# Patient Record
Sex: Female | Born: 2000 | Race: Black or African American | Hispanic: No | Marital: Single | Smoking: Never smoker
Health system: Southern US, Community
[De-identification: ages and names within clinical notes are randomized; demographics above are authoritative.]

## PROBLEM LIST (undated history)

## (undated) DIAGNOSIS — N920 Excessive and frequent menstruation with regular cycle: Secondary | ICD-10-CM

## (undated) DIAGNOSIS — Z789 Other specified health status: Secondary | ICD-10-CM

## (undated) DIAGNOSIS — D649 Anemia, unspecified: Secondary | ICD-10-CM

## (undated) HISTORY — PX: NO PAST SURGERIES: SHX2092

## (undated) HISTORY — DX: Excessive and frequent menstruation with regular cycle: N92.0

---
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
## 2020-03-08 ENCOUNTER — Other Ambulatory Visit: Payer: Self-pay

## 2020-03-08 ENCOUNTER — Ambulatory Visit
Admission: EM | Admit: 2020-03-08 | Discharge: 2020-03-08 | Disposition: A | Discharge status: Home / Self Care | Payer: BLUE CROSS/BLUE SHIELD | Attending: Emergency Medicine | Admitting: Emergency Medicine

## 2020-03-08 ENCOUNTER — Encounter: Payer: Self-pay | Admitting: Emergency Medicine

## 2020-03-08 DIAGNOSIS — Z1152 Encounter for screening for COVID-19: Secondary | ICD-10-CM

## 2020-03-08 MED ORDER — CETIRIZINE HCL 10 MG PO TABS
10.0000 mg | ORAL_TABLET | Freq: Every day | ORAL | 0 refills | Status: DC
Start: 2020-03-08 — End: 2020-05-23

## 2020-03-08 MED ORDER — BENZONATATE 100 MG PO CAPS
100.0000 mg | ORAL_CAPSULE | Freq: Three times a day (TID) | ORAL | 0 refills | Status: DC
Start: 2020-03-08 — End: 2022-03-16

## 2020-03-08 MED ORDER — FLUTICASONE PROPIONATE 50 MCG/ACT NA SUSP
2.0000 | Freq: Every day | NASAL | 0 refills | Status: DC
Start: 2020-03-08 — End: 2020-05-23

## 2020-03-08 NOTE — ED Provider Notes (Signed)
[name]    CSN: [id number] Arrival date & time: 03/08/20  1359      History   Chief Complaint Chief Complaint  Patient presents with  . Sore Throat  . Fatigue    HPI Kristine Conway is a 20 y.o. female  History was provided by the patient. Kristine Conway is a 20 y.o. female who presents for evaluation of a sore throat. Associated symptoms include sore throat. Onset of symptoms was 3 days ago, unchanged since that time.  She is drinking plenty of fluids. She has not had recent close exposure to someone with proven streptococcal pharyngitis. The following portions of the patient's history were reviewed and updated as appropriate: allergies, current medications, past family history, past medical history, past social history, past surgical history and problem list.     Past Medical History:  Diagnosis Date  . Anemia   . Medical history non-contributory   . Menorrhagia with regular cycle 03/17/2017    Patient Active Problem List   Diagnosis Date Noted  . Chlamydia 10/05/2018  . Menorrhagia with regular cycle 03/17/2017  . Iron deficiency anemia due to chronic blood loss 03/17/2017    Past Surgical History:  Procedure Laterality Date  . NO PAST SURGERIES      OB History    Gravida  0   Para  0   Term  0   Preterm  0   AB  0   Living  0     SAB  0   IAB  0   Ectopic  0   Multiple  0   Live Births  0            Home Medications    Prior to Admission medications   Medication Sig Start Date End Date Taking? Authorizing Provider  benzonatate (TESSALON) 100 MG capsule Take 1 capsule (100 mg total) by mouth every 8 (eight) hours. 03/08/20  Yes Hall-Potvin, [place], PA-C  cetirizine (ZYRTEC ALLERGY) 10 MG tablet Take 1 tablet (10 mg total) by mouth daily. 03/08/20  Yes Hall-Potvin, [place], PA-C  fluticasone (FLONASE) 50 MCG/ACT nasal spray Place 2 sprays into both nostrils daily. 03/08/20   Hall-Potvin, [place], PA-C  ipratropium  (ATROVENT) 0.06 % nasal spray Place 2 sprays into both nostrils 4 (four) times daily. 07/16/19   Cathie Hoops, Amy V, PA-C  omeprazole (PRILOSEC) 10 MG capsule Take 10 mg by mouth daily.    [provider]    Family History Family History  Problem Relation Age of Onset  . Hypertension Mother     Social History Social History   Tobacco Use  . Smoking status: Never Smoker  . Smokeless tobacco: Never Used  Vaping Use  . Vaping Use: Never used  Substance Use Topics  . Alcohol use: Yes    Alcohol/week: 1.0 standard drink    Types: 1 Shots of liquor per week    Comment: occas.  . Drug use: Not Currently    Comment: Last used 02/25/18     Allergies   Patient has no known allergies.   Review of Systems Review of Systems  Constitutional: Positive for fatigue. Negative for fever.  HENT: Positive for sore throat. Negative for congestion, dental problem, ear pain, facial swelling, hearing loss, sinus pain, trouble swallowing and voice change.   Eyes: Negative for photophobia, pain and visual disturbance.  Respiratory: Positive for cough. Negative for shortness of breath.   Cardiovascular: Negative for chest pain and palpitations.  Gastrointestinal: Negative for diarrhea  and vomiting.  Musculoskeletal: Negative for arthralgias and myalgias.  Neurological: Negative for dizziness and headaches.     Physical Exam Triage Vital Signs ED Triage Vitals [03/08/20 1455]  Enc Vitals Group     BP 119/70     Pulse Rate 95     Resp 16     Temp 98.4 F (36.9 C)     Temp Source Oral     SpO2 98 %     Weight      Height      Head Circumference      Peak Flow      Pain Score 6     Pain Loc      Pain Edu?      Excl. in [place]?    No data found.  Updated Vital Signs BP 119/70 (BP Location: Right Arm)   Pulse 95   Temp 98.4 F (36.9 C) (Oral)   Resp 16   LMP 02/23/2020   SpO2 98%   Visual Acuity Right Eye Distance:   Left Eye Distance:   Bilateral Distance:    Right Eye Near:    Left Eye Near:    Bilateral Near:     Physical Exam Constitutional:      General: She is not in acute distress.    Appearance: She is not ill-appearing or diaphoretic.  HENT:     Head: Normocephalic and atraumatic.     Right Ear: Tympanic membrane and ear canal normal.     Left Ear: Tympanic membrane and ear canal normal.     Mouth/Throat:     Mouth: Mucous membranes are moist.     Pharynx: Oropharynx is clear. No oropharyngeal exudate or posterior oropharyngeal erythema.  Eyes:     General: No scleral icterus.    Conjunctiva/sclera: Conjunctivae normal.     Pupils: Pupils are equal, round, and reactive to light.  Neck:     Comments: Trachea midline, negative JVD Cardiovascular:     Rate and Rhythm: Normal rate and regular rhythm.     Heart sounds: No murmur heard. No gallop.   Pulmonary:     Effort: Pulmonary effort is normal. No respiratory distress.     Breath sounds: No wheezing, rhonchi or rales.  Musculoskeletal:     Cervical back: Neck supple. No tenderness.  Lymphadenopathy:     Cervical: No cervical adenopathy.  Skin:    Capillary Refill: Capillary refill takes less than 2 seconds.     Coloration: Skin is not jaundiced or pale.     Findings: No rash.  Neurological:     General: No focal deficit present.     Mental Status: She is alert and oriented to person, place, and time.      UC Treatments / Results  Labs (all labs ordered are listed, but only abnormal results are displayed) Labs Reviewed  NOVEL CORONAVIRUS, NAA    EKG   Radiology No results found.  Procedures Procedures (including critical care time)  Medications Ordered in UC Medications - No data to display  Initial Impression / Assessment and Plan / UC Course  I have reviewed the triage vital signs and the nursing notes.  Pertinent labs & imaging results that were available during my care of the patient were reviewed by me and considered in my medical decision making (see chart for  details).     Patient afebrile, nontoxic, with SpO2 98%.  Covid PCR pending.  Patient to quarantine until results are back.  We will  treat supportively as outlined below.  Return precautions discussed, patient verbalized understanding and is agreeable to plan. Final Clinical Impressions(s) / UC Diagnoses   Final diagnoses:  Encounter for screening for COVID-19   Discharge Instructions   None    ED Prescriptions    Medication Sig Dispense Auth. Provider   fluticasone (FLONASE) 50 MCG/ACT nasal spray Place 2 sprays into both nostrils daily. 16 g Hall-Potvin, [place], PA-C   cetirizine (ZYRTEC ALLERGY) 10 MG tablet Take 1 tablet (10 mg total) by mouth daily. 30 tablet Hall-Potvin, [place], PA-C   benzonatate (TESSALON) 100 MG capsule Take 1 capsule (100 mg total) by mouth every 8 (eight) hours. 21 capsule Hall-Potvin, [place], PA-C     PDMP not reviewed this encounter.   Hall-Potvin, [place], [place] 03/08/20 1628

## 2020-03-08 NOTE — ED Triage Notes (Signed)
PT reports 3 days sore throat and general body aches

## 2020-03-11 LAB — NOVEL CORONAVIRUS, NAA: SARS-CoV-2, NAA: DETECTED — AB

## 2020-05-23 ENCOUNTER — Ambulatory Visit
Admission: EM | Admit: 2020-05-23 | Discharge: 2020-05-23 | Disposition: A | Discharge status: Home / Self Care | Payer: BLUE CROSS/BLUE SHIELD | Attending: Urgent Care | Admitting: Urgent Care

## 2020-05-23 ENCOUNTER — Other Ambulatory Visit: Payer: Self-pay

## 2020-05-23 DIAGNOSIS — R070 Pain in throat: Secondary | ICD-10-CM | POA: Insufficient documentation

## 2020-05-23 DIAGNOSIS — Z862 Personal history of diseases of the blood and blood-forming organs and certain disorders involving the immune mechanism: Secondary | ICD-10-CM | POA: Diagnosis not present

## 2020-05-23 DIAGNOSIS — J029 Acute pharyngitis, unspecified: Secondary | ICD-10-CM | POA: Insufficient documentation

## 2020-05-23 DIAGNOSIS — K146 Glossodynia: Secondary | ICD-10-CM | POA: Diagnosis not present

## 2020-05-23 LAB — POCT RAPID STREP A (OFFICE): Rapid Strep A Screen: NEGATIVE

## 2020-05-23 MED ORDER — FLUTICASONE PROPIONATE 50 MCG/ACT NA SUSP
2.0000 | Freq: Every day | NASAL | 0 refills | Status: DC
Start: 2020-05-23 — End: 2022-03-16

## 2020-05-23 MED ORDER — CETIRIZINE HCL 10 MG PO TABS
10.0000 mg | ORAL_TABLET | Freq: Every day | ORAL | 0 refills | Status: DC
Start: 2020-05-23 — End: 2022-03-16

## 2020-05-23 MED ORDER — PSEUDOEPHEDRINE HCL 30 MG PO TABS
30.0000 mg | ORAL_TABLET | Freq: Three times a day (TID) | ORAL | 0 refills | Status: DC | PRN
Start: 2020-05-23 — End: 2022-03-16

## 2020-05-23 NOTE — ED Provider Notes (Signed)
[facility]   MRN: [id number] DOB: Mar 01, 2000  Subjective:   Kristine Conway is a 20 y.o. female presenting for 2-day history of acute onset throat pain, difficulty swallowing, neck soreness, now having difficulty with tongue swelling and tongue pain.  Has used over-the-counter medications with minimal relief.  Denies fever, runny or stuffy nose, cough, chest pain, shortness of breath, dental pain, dental infections.  Patient does have a history of chronic iron deficiency anemia.  No history of vitamin B12 deficiency.  Denies starting any new foods or oral medications.  Denies history of anaphylactic reactions.  No current facility-administered medications for this encounter.  Current Outpatient Medications:  .  benzonatate (TESSALON) 100 MG capsule, Take 1 capsule (100 mg total) by mouth every 8 (eight) hours., Disp: 21 capsule, Rfl: 0 .  cetirizine (ZYRTEC ALLERGY) 10 MG tablet, Take 1 tablet (10 mg total) by mouth daily., Disp: 30 tablet, Rfl: 0 .  fluticasone (FLONASE) 50 MCG/ACT nasal spray, Place 2 sprays into both nostrils daily., Disp: 16 g, Rfl: 0 .  ipratropium (ATROVENT) 0.06 % nasal spray, Place 2 sprays into both nostrils 4 (four) times daily., Disp: 15 mL, Rfl: 0 .  omeprazole (PRILOSEC) 10 MG capsule, Take 10 mg by mouth daily., Disp: , Rfl:    No Known Allergies  Past Medical History:  Diagnosis Date  . Anemia   . Medical history non-contributory   . Menorrhagia with regular cycle 03/17/2017     Past Surgical History:  Procedure Laterality Date  . NO PAST SURGERIES      Family History  Problem Relation Age of Onset  . Hypertension Mother     Social History   Tobacco Use  . Smoking status: Never Smoker  . Smokeless tobacco: Never Used  Vaping Use  . Vaping Use: Never used  Substance Use Topics  . Alcohol use: Yes    Alcohol/week: 1.0 standard drink    Types: 1 Shots of liquor per week    Comment: occas.  . Drug use: Not Currently     Comment: Last used 02/25/18    ROS   Objective:   Vitals: BP 117/80   Pulse 62   Temp (!) 97.2 F (36.2 C)   Resp 18   LMP 05/19/2020   SpO2 99%   Physical Exam Constitutional:      General: She is not in acute distress.    Appearance: Normal appearance. She is well-developed. She is not ill-appearing, toxic-appearing or diaphoretic.  HENT:     Head: Normocephalic and atraumatic.     Right Ear: Tympanic membrane, ear canal and external ear normal. No drainage or tenderness. No middle ear effusion. Tympanic membrane is not erythematous.     Left Ear: Tympanic membrane, ear canal and external ear normal. No drainage or tenderness.  No middle ear effusion. Tympanic membrane is not erythematous.     Nose: Nose normal. No congestion or rhinorrhea.     Mouth/Throat:     Mouth: Mucous membranes are moist. No oral lesions.     Pharynx: No pharyngeal swelling, oropharyngeal exudate, posterior oropharyngeal erythema or uvula swelling.     Tonsils: No tonsillar exudate or tonsillar abscesses.  Eyes:     General: No scleral icterus.       Right eye: No discharge.        Left eye: No discharge.     Extraocular Movements: Extraocular movements intact.     Right eye: Normal extraocular motion.     Left  eye: Normal extraocular motion.     Conjunctiva/sclera: Conjunctivae normal.     Pupils: Pupils are equal, round, and reactive to light.  Cardiovascular:     Rate and Rhythm: Normal rate and regular rhythm.     Pulses: Normal pulses.     Heart sounds: Normal heart sounds. No murmur heard. No friction rub. No gallop.   Pulmonary:     Effort: Pulmonary effort is normal. No respiratory distress.     Breath sounds: Normal breath sounds. No stridor. No wheezing, rhonchi or rales.  Musculoskeletal:     Cervical back: Normal range of motion and neck supple.  Lymphadenopathy:     Cervical: No cervical adenopathy.  Skin:    General: Skin is warm and dry.     Findings: No rash.   Neurological:     General: No focal deficit present.     Mental Status: She is alert and oriented to person, place, and time.  Psychiatric:        Mood and Affect: Mood normal.        Behavior: Behavior normal.        Thought Content: Thought content normal.        Judgment: Judgment normal.     Results for orders placed or performed during the hospital encounter of 05/23/20 (from the past 24 hour(s))  POCT rapid strep A     Status: None   Collection Time: 05/23/20  9:58 AM  Result Value Ref Range   Rapid Strep A Screen Negative Negative    Assessment and Plan :   PDMP not reviewed this encounter.  1. Acute pharyngitis, unspecified etiology   2. Throat pain   3. Tongue pain   4. History of anemia     Strep culture pending, will manage with supportive care.  Suspect postnasal drainage is primary source of her throat pain.  Diagnosed with pharyngitis which could be viral we will up with strep culture results.  Regarding her tongue symptoms, suspect that this is more of a chronic issue related to her iron deficiency anemia.  Recommended she follow-up with her PCP as soon as possible.  Oral exam is very benign and reassured patient.  There is no history of anaphylactic reaction and therefore have no suspicion for this either as patient does not have any specific allergic reaction symptoms, new exposures. Counseled patient on potential for adverse effects with medications prescribed/recommended today, ER and return-to-clinic precautions discussed, patient verbalized understanding.    Wallis Bamberg, PA-C 05/23/20 1029

## 2020-05-23 NOTE — ED Triage Notes (Signed)
Pt presents with tongue swelling and sore throat for past few days, no difficulty breathing

## 2020-05-26 LAB — CULTURE, GROUP A STREP (THRC)

## 2020-07-31 ENCOUNTER — Emergency Department (HOSPITAL_COMMUNITY): Admit: 2020-07-31 | Payer: BLUE CROSS/BLUE SHIELD

## 2020-07-31 ENCOUNTER — Emergency Department (HOSPITAL_COMMUNITY)
Admission: EM | Admit: 2020-07-31 | Discharge: 2020-07-31 | Disposition: A | Discharge status: Home / Self Care | Payer: BLUE CROSS/BLUE SHIELD | Attending: Emergency Medicine | Admitting: Emergency Medicine

## 2020-07-31 DIAGNOSIS — R1013 Epigastric pain: Secondary | ICD-10-CM | POA: Insufficient documentation

## 2020-07-31 DIAGNOSIS — D649 Anemia, unspecified: Secondary | ICD-10-CM | POA: Insufficient documentation

## 2020-07-31 LAB — CBC WITH DIFFERENTIAL/PLATELET
Abs Immature Granulocytes: 0 10*3/uL (ref 0.00–0.07)
Basophils Absolute: 0 10*3/uL (ref 0.0–0.1)
Basophils Relative: 1 %
Eosinophils Absolute: 0.1 10*3/uL (ref 0.0–0.5)
Eosinophils Relative: 2 %
HCT: 26.6 % — ABNORMAL LOW (ref 36.0–46.0)
Hemoglobin: 7.4 g/dL — ABNORMAL LOW (ref 12.0–15.0)
Immature Granulocytes: 0 %
Lymphocytes Relative: 43 %
Lymphs Abs: 2.4 10*3/uL (ref 0.7–4.0)
MCH: 17.5 pg — ABNORMAL LOW (ref 26.0–34.0)
MCHC: 27.8 g/dL — ABNORMAL LOW (ref 30.0–36.0)
MCV: 62.7 fL — ABNORMAL LOW (ref 80.0–100.0)
Monocytes Absolute: 0.5 10*3/uL (ref 0.1–1.0)
Monocytes Relative: 9 %
Neutro Abs: 2.6 10*3/uL (ref 1.7–7.7)
Neutrophils Relative %: 45 %
Platelets: 258 10*3/uL (ref 150–400)
RBC: 4.24 MIL/uL (ref 3.87–5.11)
RDW: 19.8 % — ABNORMAL HIGH (ref 11.5–15.5)
WBC: 5.6 10*3/uL (ref 4.0–10.5)
nRBC: 0 % (ref 0.0–0.2)

## 2020-07-31 LAB — COMPREHENSIVE METABOLIC PANEL
ALT: 16 U/L (ref 0–44)
AST: 24 U/L (ref 15–41)
Albumin: 4.7 g/dL (ref 3.5–5.0)
Alkaline Phosphatase: 41 U/L (ref 38–126)
Anion gap: 5 (ref 5–15)
BUN: 9 mg/dL (ref 6–20)
CO2: 25 mmol/L (ref 22–32)
Calcium: 9.4 mg/dL (ref 8.9–10.3)
Chloride: 106 mmol/L (ref 98–111)
Creatinine, Ser: 0.55 mg/dL (ref 0.44–1.00)
GFR, Estimated: >60 mL/min (ref >60)
Glucose, Bld: 102 mg/dL — ABNORMAL HIGH (ref 70–99)
Potassium: 3.7 mmol/L (ref 3.5–5.1)
Sodium: 136 mmol/L (ref 135–145)
Total Bilirubin: 0.2 mg/dL — ABNORMAL LOW (ref 0.3–1.2)
Total Protein: 8.7 g/dL — ABNORMAL HIGH (ref 6.5–8.1)

## 2020-07-31 LAB — I-STAT BETA HCG BLOOD, ED (MC, WL, AP ONLY): I-stat hCG, quantitative: <5 m[IU]/mL (ref <5)

## 2020-07-31 LAB — URINALYSIS, ROUTINE W REFLEX MICROSCOPIC
Bilirubin Urine: NEGATIVE
Glucose, UA: NEGATIVE mg/dL
Hgb urine dipstick: NEGATIVE
Ketones, ur: NEGATIVE mg/dL
Nitrite: NEGATIVE
Protein, ur: NEGATIVE mg/dL
Specific Gravity, Urine: 1.02 (ref 1.005–1.030)
pH: 7 (ref 5.0–8.0)

## 2020-07-31 MED ORDER — LIDOCAINE VISCOUS HCL 2 % MT SOLN
15.0000 mL | Freq: Once | OROMUCOSAL | Status: AC
  Administered 2020-07-31: 15 mL via ORAL
  Filled 2020-07-31: qty 15

## 2020-07-31 MED ORDER — FERROUS SULFATE 325 (65 FE) MG PO TABS
325.0000 mg | ORAL_TABLET | Freq: Every day | ORAL | 1 refills | Status: DC
Start: 2020-07-31 — End: 2021-12-19

## 2020-07-31 MED ORDER — ALUM & MAG HYDROXIDE-SIMETH 200-200-20 MG/5ML PO SUSP
30.0000 mL | Freq: Once | ORAL | Status: AC
  Administered 2020-07-31: 30 mL via ORAL
  Filled 2020-07-31: qty 30

## 2020-07-31 MED ORDER — OMEPRAZOLE 20 MG PO CPDR
20.0000 mg | DELAYED_RELEASE_CAPSULE | Freq: Every day | ORAL | 1 refills | Status: DC
Start: 2020-07-31 — End: 2023-06-28

## 2020-07-31 NOTE — ED Triage Notes (Signed)
Pt c/o mid upper abd pain starting last night. Reports n/v, denies fevers or urinary symptoms.

## 2020-07-31 NOTE — ED Provider Notes (Signed)
[facility]-EMERGENCY DEPT Provider Note   CSN: [id number] Arrival date & time: 07/31/20  0150     History Chief Complaint  Patient presents with   Abdominal Pain    Kristine Conway is a 20 y.o. female.  Patient is a 20 year old female with past medical history of iron deficiency anemia related to chronic blood loss from heavy menstrual periods.  Patient presenting today with complaints of epigastric discomfort.  She describes a constant pain to the epigastric region that has been present for the past several days.  She has felt nauseated and describes one episode of nonbloody vomiting.  She denies any black or tarry stools.  She denies any fevers or chills.  She does tell me she ran out of her omeprazole 2 months ago.  The history is provided by the patient.  Abdominal Pain Pain location:  Epigastric Pain quality: cramping   Pain radiates to:  Does not radiate Pain severity:  Moderate Onset quality:  Sudden Duration:  3 days Timing:  Constant Progression:  Unchanged Chronicity:  New Relieved by:  Nothing Worsened by:  Nothing Ineffective treatments:  None tried     Past Medical History:  Diagnosis Date   Anemia    Medical history non-contributory    Menorrhagia with regular cycle 03/17/2017    Patient Active Problem List   Diagnosis Date Noted   Chlamydia 10/05/2018   Menorrhagia with regular cycle 03/17/2017   Iron deficiency anemia due to chronic blood loss 03/17/2017    Past Surgical History:  Procedure Laterality Date   NO PAST SURGERIES       OB History     Gravida  0   Para  0   Term  0   Preterm  0   AB  0   Living  0      SAB  0   IAB  0   Ectopic  0   Multiple  0   Live Births  0           Family History  Problem Relation Age of Onset   Hypertension Mother     Social History   Tobacco Use   Smoking status: Never   Smokeless tobacco: Never  Vaping Use   Vaping Use: Never used  Substance Use  Topics   Alcohol use: Yes    Alcohol/week: 1.0 standard drink    Types: 1 Shots of liquor per week    Comment: occas.   Drug use: Not Currently    Comment: Last used 02/25/18    Home Medications Prior to Admission medications   Medication Sig Start Date End Date Taking? Authorizing Provider  benzonatate (TESSALON) 100 MG capsule Take 1 capsule (100 mg total) by mouth every 8 (eight) hours. 03/08/20   Hall-Potvin, [place], PA-C  cetirizine (ZYRTEC ALLERGY) 10 MG tablet Take 1 tablet (10 mg total) by mouth daily. 05/23/20   Wallis Bamberg, PA-C  fluticasone (FLONASE) 50 MCG/ACT nasal spray Place 2 sprays into both nostrils daily. 05/23/20   Wallis Bamberg, PA-C  ipratropium (ATROVENT) 0.06 % nasal spray Place 2 sprays into both nostrils 4 (four) times daily. 07/16/19   Cathie Hoops, Amy V, PA-C  omeprazole (PRILOSEC) 10 MG capsule Take 10 mg by mouth daily.    [provider]  pseudoephedrine (SUDAFED) 30 MG tablet Take 1 tablet (30 mg total) by mouth every 8 (eight) hours as needed for congestion. 05/23/20   Wallis Bamberg, PA-C    Allergies  Patient has no known allergies.  Review of Systems   Review of Systems  Gastrointestinal:  Positive for abdominal pain.  All other systems reviewed and are negative.  Physical Exam Updated Vital Signs BP 129/77   Pulse 86   Temp 97.7 F (36.5 C) (Oral)   Resp 18   Ht 5\' 3"  (1.6 m)   Wt 56.7 kg   LMP 07/12/2020 (Exact Date)   SpO2 100%   BMI 22.14 kg/m   Physical Exam Vitals and nursing note reviewed.  Constitutional:      General: She is not in acute distress.    Appearance: She is well-developed. She is not diaphoretic.  HENT:     Head: Normocephalic and atraumatic.  Cardiovascular:     Rate and Rhythm: Normal rate and regular rhythm.     Heart sounds: No murmur heard.   No friction rub. No gallop.  Pulmonary:     Effort: Pulmonary effort is normal. No respiratory distress.     Breath sounds: Normal breath sounds. No wheezing.  Abdominal:      General: Bowel sounds are normal. There is no distension.     Palpations: Abdomen is soft.     Tenderness: There is abdominal tenderness in the epigastric area. There is no right CVA tenderness, left CVA tenderness, guarding or rebound.  Musculoskeletal:        General: Normal range of motion.     Cervical back: Normal range of motion and neck supple.  Skin:    General: Skin is warm and dry.  Neurological:     General: No focal deficit present.     Mental Status: She is alert and oriented to person, place, and time.    ED Results / Procedures / Treatments   Labs (all labs ordered are listed, but only abnormal results are displayed) Labs Reviewed  CBC WITH DIFFERENTIAL/PLATELET - Abnormal; Notable for the following components:      Result Value   Hemoglobin 7.4 (*)    HCT 26.6 (*)    MCV 62.7 (*)    MCH 17.5 (*)    MCHC 27.8 (*)    RDW 19.8 (*)    All other components within normal limits  COMPREHENSIVE METABOLIC PANEL - Abnormal; Notable for the following components:   Glucose, Bld 102 (*)    Total Protein 8.7 (*)    Total Bilirubin 0.2 (*)    All other components within normal limits  URINALYSIS, ROUTINE W REFLEX MICROSCOPIC - Abnormal; Notable for the following components:   APPearance CLOUDY (*)    Leukocytes,Ua SMALL (*)    Bacteria, UA RARE (*)    All other components within normal limits  I-STAT BETA HCG BLOOD, ED ([facility], [facility], [facility] ONLY)    EKG None  Radiology No results found.  Procedures Procedures   Medications Ordered in ED Medications  alum & mag hydroxide-simeth (MAALOX/MYLANTA) 200-200-20 MG/5ML suspension 30 mL (has no administration in time range)    And  lidocaine (XYLOCAINE) 2 % viscous mouth solution 15 mL (has no administration in time range)    ED Course  I have reviewed the triage vital signs and the nursing notes.  Pertinent labs & imaging results that were available during my care of the patient were reviewed by me and considered in my  medical decision making (see chart for details).    MDM Rules/Calculators/A&P  Patient presenting here with complaints of epigastric discomfort, the cause of which I suspect is gastritis.  Patient had  been on Prilosec until 2 months ago.  She has no right upper quadrant tenderness, normal LFTs, and no white count.  I highly doubt gallbladder.  She seems to be feeling better after a GI cocktail.  Patient will be restarted on her Prilosec.  She was also found to have a microcytic anemia with hemoglobin of 7.4 and MCV of 62.  Patient takes a multivitamin, but I will also start her on an iron pill.  She is to follow these issues up with her primary doctor.  Final Clinical Impression(s) / ED Diagnoses Final diagnoses:  None    Rx / DC Orders ED Discharge Orders     None        Geoffery Lyons, MD 07/31/20 [phone]

## 2020-07-31 NOTE — Discharge Instructions (Addendum)
Begin taking Prilosec and iron as prescribed.  Follow-up with your primary doctor for a repeat check of your hemoglobin in the next 2 weeks.  Return to the emergency department if you develop worsening pain, high fever, bloody stools, or other new and concerning symptoms.

## 2020-08-01 ENCOUNTER — Telehealth: Payer: Self-pay | Admitting: *Deleted

## 2020-08-01 NOTE — Telephone Encounter (Signed)
Transition Care Management Unsuccessful Follow-up Telephone Call  Date of discharge and from where:  07/31/2020 - [facility]  Attempts:  1st Attempt  Reason for unsuccessful TCM follow-up call:  Left voice message

## 2020-08-04 NOTE — Telephone Encounter (Signed)
Transition Care Management Unsuccessful Follow-up Telephone Call  Date of discharge and from where:  07/31/2020 - [facility]  Attempts:  2nd Attempt  Reason for unsuccessful TCM follow-up call:  Left voice message

## 2020-08-05 NOTE — Telephone Encounter (Signed)
Transition Care Management Follow-up Telephone Call Date of discharge and from where: 07/31/2020 - [facility] How have you been since you were released from the hospital? "Better" Any questions or concerns? No  Items Reviewed: Did the pt receive and understand the discharge instructions provided? Yes  Medications obtained and verified? Yes  Other? No  Any new allergies since your discharge? No  Dietary orders reviewed? No Do you have support at home? Yes    Functional Questionnaire: (I = Independent and D = Dependent) ADLs: I  Bathing/Dressing- I  Meal Prep- I  Eating- I  Maintaining continence- I  Transferring/Ambulation- I  Managing Meds- I  Follow up appointments reviewed:  PCP Hospital f/u appt confirmed? No   Specialist Hospital f/u appt confirmed? No   Are transportation arrangements needed? No  If their condition worsens, is the pt aware to call PCP or go to the Emergency Dept.? Yes Was the patient provided with contact information for the PCP's office or ED? Yes Was to pt encouraged to call back with questions or concerns? Yes

## 2020-12-08 ENCOUNTER — Ambulatory Visit: Payer: Self-pay | Admitting: Family

## 2021-03-20 ENCOUNTER — Ambulatory Visit
Admission: EM | Admit: 2021-03-20 | Discharge: 2021-03-20 | Disposition: A | Discharge status: Home / Self Care | Payer: Self-pay | Attending: Internal Medicine | Admitting: Internal Medicine

## 2021-03-20 DIAGNOSIS — N3000 Acute cystitis without hematuria: Secondary | ICD-10-CM | POA: Insufficient documentation

## 2021-03-20 LAB — POCT URINALYSIS DIP (MANUAL ENTRY)
Bilirubin, UA: NEGATIVE
Blood, UA: NEGATIVE
Glucose, UA: NEGATIVE mg/dL
Leukocytes, UA: NEGATIVE
Nitrite, UA: POSITIVE — AB
Spec Grav, UA: 1.02 (ref 1.010–1.025)
Urobilinogen, UA: 1 E.U./dL
pH, UA: 8.5 — AB (ref 5.0–8.0)

## 2021-03-20 MED ORDER — NITROFURANTOIN MONOHYD MACRO 100 MG PO CAPS: 100.0000 mg | ORAL_CAPSULE | Freq: Two times a day (BID) | ORAL | 0 refills | Status: DC

## 2021-03-20 NOTE — ED Provider Notes (Signed)
[name]    CSN: [id number] Arrival date & time: 03/20/21  1523      History   Chief Complaint Chief Complaint  Patient presents with   urine odor    HPI Kristine Conway is a 21 y.o. female.   Patient presents with malodorous urine and urinary frequency that has been present for approximately 1 month.  Denies any urinary burning but does endorse some lower abdominal cramping when she urinates.  She has also had some occasional vaginal spotting that is light.  Last normal menstrual cycle was 02/26/2021.  Denies any known exposure to STD or chance of pregnancy as she has intercourse with a female partner.  Denies pelvic pain, vaginal discharge, back pain, hematuria.  Denies dizziness, chest pain, shortness of breath, headache, nausea, vomiting.    Past Medical History:  Diagnosis Date   Anemia    Medical history non-contributory    Menorrhagia with regular cycle 03/17/2017    Patient Active Problem List   Diagnosis Date Noted   Chlamydia 10/05/2018   Menorrhagia with regular cycle 03/17/2017   Iron deficiency anemia due to chronic blood loss 03/17/2017    Past Surgical History:  Procedure Laterality Date   NO PAST SURGERIES      OB History     Gravida  0   Para  0   Term  0   Preterm  0   AB  0   Living  0      SAB  0   IAB  0   Ectopic  0   Multiple  0   Live Births  0            Home Medications    Prior to Admission medications   Medication Sig Start Date End Date Taking? Authorizing Provider  nitrofurantoin, macrocrystal-monohydrate, (MACROBID) 100 MG capsule Take 1 capsule (100 mg total) by mouth 2 (two) times daily. 03/20/21  Yes Bernetha Anschutz, Hildred Alamin E, FNP  benzonatate (TESSALON) 100 MG capsule Take 1 capsule (100 mg total) by mouth every 8 (eight) hours. 03/08/20   Hall-Potvin, [place], PA-C  cetirizine (ZYRTEC ALLERGY) 10 MG tablet Take 1 tablet (10 mg total) by mouth daily. 05/23/20   Jaynee Eagles, PA-C  ferrous sulfate 325 (65  FE) MG tablet Take 1 tablet (325 mg total) by mouth daily. 07/31/20   Veryl Speak, MD  fluticasone (FLONASE) 50 MCG/ACT nasal spray Place 2 sprays into both nostrils daily. 05/23/20   Jaynee Eagles, PA-C  ipratropium (ATROVENT) 0.06 % nasal spray Place 2 sprays into both nostrils 4 (four) times daily. 07/16/19   Tasia Catchings, Amy V, PA-C  omeprazole (PRILOSEC) 20 MG capsule Take 1 capsule (20 mg total) by mouth daily. 07/31/20   Veryl Speak, MD  pseudoephedrine (SUDAFED) 30 MG tablet Take 1 tablet (30 mg total) by mouth every 8 (eight) hours as needed for congestion. 05/23/20   Jaynee Eagles, PA-C    Family History Family History  Problem Relation Age of Onset   Hypertension Mother     Social History Social History   Tobacco Use   Smoking status: Never   Smokeless tobacco: Never  Vaping Use   Vaping Use: Never used  Substance Use Topics   Alcohol use: Yes    Alcohol/week: 1.0 standard drink    Types: 1 Shots of liquor per week    Comment: occas.   Drug use: Not Currently    Comment: Last used 02/25/18     Allergies   Patient  has no known allergies.   Review of Systems Review of Systems Per HPI  Physical Exam Triage Vital Signs ED Triage Vitals  Enc Vitals Group     BP 03/20/21 1646 127/72     Pulse Rate 03/20/21 1646 73     Resp 03/20/21 1646 18     Temp 03/20/21 1646 98.2 F (36.8 C)     Temp Source 03/20/21 1646 Oral     SpO2 03/20/21 1646 100 %     Weight --      Height --      Head Circumference --      Peak Flow --      Pain Score 03/20/21 1647 0     Pain Loc --      Pain Edu? --      Excl. in [place]? --    No data found.  Updated Vital Signs BP 127/72 (BP Location: Right Arm)    Pulse 73    Temp 98.2 F (36.8 C) (Oral)    Resp 18    LMP 02/26/2021 (Exact Date)    SpO2 100%   Visual Acuity Right Eye Distance:   Left Eye Distance:   Bilateral Distance:    Right Eye Near:   Left Eye Near:    Bilateral Near:     Physical Exam Constitutional:      General: She is  not in acute distress.    Appearance: Normal appearance. She is not toxic-appearing or diaphoretic.  HENT:     Head: Normocephalic and atraumatic.  Eyes:     Extraocular Movements: Extraocular movements intact.     Conjunctiva/sclera: Conjunctivae normal.  Cardiovascular:     Rate and Rhythm: Normal rate and regular rhythm.     Pulses: Normal pulses.     Heart sounds: Normal heart sounds.  Pulmonary:     Effort: Pulmonary effort is normal. No respiratory distress.     Breath sounds: Normal breath sounds.  Abdominal:     General: Abdomen is flat. Bowel sounds are normal. There is no distension.     Palpations: Abdomen is soft.     Tenderness: There is no abdominal tenderness.  Genitourinary:    Comments: Deferred with shared decision making.  Self swab performed. Neurological:     General: No focal deficit present.     Mental Status: She is alert and oriented to person, place, and time. Mental status is at baseline.  Psychiatric:        Mood and Affect: Mood normal.        Behavior: Behavior normal.        Thought Content: Thought content normal.        Judgment: Judgment normal.     UC Treatments / Results  Labs (all labs ordered are listed, but only abnormal results are displayed) Labs Reviewed  POCT URINALYSIS DIP (MANUAL ENTRY) - Abnormal; Notable for the following components:      Result Value   Clarity, UA hazy (*)    Ketones, POC UA trace (5) (*)    pH, UA 8.5 (*)    Protein Ur, POC trace (*)    Nitrite, UA Positive (*)    All other components within normal limits  URINE CULTURE  CERVICOVAGINAL ANCILLARY ONLY    EKG   Radiology No results found.  Procedures Procedures (including critical care time)  Medications Ordered in UC Medications - No data to display  Initial Impression / Assessment and Plan / UC Course  I  have reviewed the triage vital signs and the nursing notes.  Pertinent labs & imaging results that were available during my care of the  patient were reviewed by me and considered in my medical decision making (see chart for details).     Urinalysis showing nitrites which indicates urinary tract infection.  Will treat with Macrobid antibiotic.  Patient is nontoxic-appearing, vital signs are stable, so do not think that there is any need of immediate medical attention at the hospital at this time.  Urine culture is pending.  Cervicovaginal swab pending to rule out vaginitis given the patient has had some intermittent vaginal spotting.  Patient will need to follow-up with gynecology for further evaluation and management of vaginal spotting.  Discussed return precautions.  Patient verbalized understanding and was agreeable with plan. Final Clinical Impressions(s) / UC Diagnoses   Final diagnoses:  Acute cystitis without hematuria     Discharge Instructions      It appears that you have a urinary tract infection which is being treated with antibiotic.  Your urine culture and vaginal swab are pending.  We will call if there are any abnormalities.  Please follow-up with gynecology for vaginal spotting.    ED Prescriptions     Medication Sig Dispense Auth. Provider   nitrofurantoin, macrocrystal-monohydrate, (MACROBID) 100 MG capsule Take 1 capsule (100 mg total) by mouth 2 (two) times daily. 10 capsule Teodora Medici, [place]      PDMP not reviewed this encounter.   Teodora Medici, [place] 03/20/21 [phone]

## 2021-03-20 NOTE — Discharge Instructions (Signed)
It appears that you have a urinary tract infection which is being treated with antibiotic.  Your urine culture and vaginal swab are pending.  We will call if there are any abnormalities.  Please follow-up with gynecology for vaginal spotting.

## 2021-03-20 NOTE — ED Triage Notes (Signed)
Approx 65mo h/o foul urine odor and urinary frequency. Pt reports that her urine color will alternate between dark and light yellow. Notes intermittent random cramping sensation when she urinates, but denies dysuria. No meds taken. No vaginal discharge, itching or irritation.

## 2021-03-22 LAB — URINE CULTURE: Culture: >=100000 — AB

## 2021-03-23 ENCOUNTER — Telehealth (HOSPITAL_COMMUNITY): Payer: Self-pay | Admitting: Emergency Medicine

## 2021-03-23 LAB — CERVICOVAGINAL ANCILLARY ONLY
Bacterial Vaginitis (gardnerella): POSITIVE — AB
Candida Glabrata: NEGATIVE
Candida Vaginitis: NEGATIVE
Chlamydia: NEGATIVE
Comment: NEGATIVE
Comment: NEGATIVE
Comment: NEGATIVE
Comment: NEGATIVE
Comment: NEGATIVE
Comment: NORMAL
Neisseria Gonorrhea: NEGATIVE
Trichomonas: NEGATIVE

## 2021-03-23 MED ORDER — METRONIDAZOLE 500 MG PO TABS: 500.0000 mg | ORAL_TABLET | Freq: Two times a day (BID) | ORAL | 0 refills | Status: DC
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
## 2021-04-27 ENCOUNTER — Telehealth (HOSPITAL_COMMUNITY): Payer: Self-pay | Admitting: Licensed Clinical Social Worker

## 2021-04-27 ENCOUNTER — Ambulatory Visit (HOSPITAL_COMMUNITY)
Admission: EM | Admit: 2021-04-27 | Discharge: 2021-04-27 | Disposition: A | Discharge status: Home / Self Care | Payer: No Payment, Other | Attending: Psychiatry | Admitting: Psychiatry

## 2021-04-27 DIAGNOSIS — Z560 Unemployment, unspecified: Secondary | ICD-10-CM | POA: Insufficient documentation

## 2021-04-27 DIAGNOSIS — F419 Anxiety disorder, unspecified: Secondary | ICD-10-CM | POA: Insufficient documentation

## 2021-04-27 DIAGNOSIS — F329 Major depressive disorder, single episode, unspecified: Secondary | ICD-10-CM | POA: Insufficient documentation

## 2021-04-27 DIAGNOSIS — F321 Major depressive disorder, single episode, moderate: Secondary | ICD-10-CM
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
## 2021-04-27 NOTE — Discharge Instructions (Signed)
Patient is instructed prior to discharge to: Take all medications as prescribed by his/her mental healthcare provider. °Report any adverse effects and or reactions from the medicines to his/her outpatient provider promptly. °Patient has been instructed & cautioned: To not engage in alcohol and or illegal drug use while on prescription medicines. °In the event of worsening symptoms, patient is instructed to call the crisis hotline, 911 and or go to the nearest ED for appropriate evaluation and treatment of symptoms. °To follow-up with his/her primary care provider for your other medical issues, concerns and or health care needs. ° °Please contact one of the following facilities to start medication management and therapy services:  ° °[facility] °[address] (SECOND FLOOR)  °[place], [place]  [address] °Phone: [phone] ° °[facility]  °[address] °[place], [place] [address] °Phone: [phone] ° °[facility] [place]  °[address]  °[place] [address] ° °[facility] [place]  °[address]  °[place], [place] [address] °Phone: [phone]  °   °

## 2021-04-27 NOTE — Discharge Summary (Signed)
[name] to be D/C'd Home per NP order.  An After Visit Summary was printed and given to the patient by provider. Patient escorted out and D/C home via private auto.  ?Clois Dupes  ?04/27/2021 2:28 PM ?  ?   ?

## 2021-04-27 NOTE — ED Provider Notes (Signed)
Behavioral Health Urgent Care Medical Screening Exam ? ?Patient Name: Kristine Conway ?MRN: [id number] ?Date of Evaluation: 04/27/21 ?Chief Complaint:   ?Diagnosis:  ?Final diagnoses:  ?Current moderate episode of major depressive disorder, unspecified whether recurrent (HCC)  ? ? ?History of Present illness: Kristine Conway is a 21 y.o. female patient presented to [facility] as a walk in accompanied by [employer] with complaints of increased depression and anxiety.  ? ?Kristine Conway, 21 y.o., female patient seen face to face by this provider, consulted with Dr. Bronwen Betters; and chart reviewed on 04/27/21.  ? ?On evaluation Kristine Conway denies any past psychiatric history. She denies being prescribed any medications. She denies any past suicide attempts or inpatient psychiatric admissions. She lives in an apartment with her girlfriend. She currently unemployed which she identifies as a stressor. States her girlfriend and her mother are "coming down on me". States it is overwhelming with everyone telling her she needs to work. States  today she became so overwhelmed she texted her mom and girlfriend and stated, " I am done and so tired of living". States she did not mean it, she just wanted them to "back off". Her mother became concerned and called the police. She was brought in by [facility] for an assessment.  ? ?During evaluation Kristine Conway is in sitting position in no acute distress. She is well groomed and makes good eye contact. She is tearful at times.  She is alert/oriented x 4; and cooperative. She is speaking in a clear tone at moderate volume, and normal pace. She endorses increased anxiety and depression with feelings of worthlessness, helplessness, low motivation, and decreased appetite. She sleeps roughly 4-6 hours per night. She has a depressed affect. Objectively she does not appear to be responding to internal/external stimuli or experiencing delusional or paranoid thought content. She denies AVH. She denies  suicidal/self-harm/homicidal ideation. She denies any intent, plan or access to means. Denies access to firearms/weapons. She endorsers marijuana use, denies tobacco and alcohol use. Educated patient on effects of marijuana related to depression and mental illness.  ? ?Collateral: Talked to Mother in the lobby, Kristine Conway with patients permission. States she has no safety concerns with patient returning home. States most of Kristine Conway problems have to do with her not getting a job and smoking marijuana. States she called the police today because the [name] scared her. ? ?At this time Kristine Conway is educated and verbalizes understanding of mental health resources and other crisis services in the community. She is instructed to call 911 and present to the nearest emergency room should she experience any suicidal/homicidal ideation, auditory/visual/hallucinations, or detrimental worsening of her mental health condition.  She was a also advised by Clinical research associate that she could call the toll-free phone on insurance card to assist with identifying in network counselors and agencies or number on back of Medicaid card to speak with care coordinator ?  ? ?Psychiatric Specialty Exam ? ?Presentation  ?General Appearance:Appropriate for Environment; Well Groomed ? ?Eye Contact:Good ? ?Speech:Clear and Coherent; Normal Rate ? ?Speech Volume:Normal ? ?Handedness:Right ? ? ?Mood and Affect  ?Mood:Depressed; Anxious ? ?Affect:Congruent; Tearful ? ? ?Thought Process  ?Thought Processes:Coherent ? ?Descriptions of Associations:Intact ? ?Orientation:Full (Time, Place and Person) ? ?Thought Content:Logical ?   Hallucinations:None ? ?Ideas of Reference:None ? ?Suicidal Thoughts:No ? ?Homicidal Thoughts:No ? ? ?Sensorium  ?Memory:Immediate Good; Recent Good; Remote Good ? ?Judgment:Good ? ?Insight:Good ? ? ?Executive Functions  ?Concentration:Good ? ?Attention Span:Good ? ?Recall:Good ? ?Fund of  Knowledge:Good ? ?  Language:Good ? ? ?Psychomotor Activity  ?Psychomotor Activity:Normal ? ? ?Assets  ?Assets:Communication Skills; Desire for Improvement; Financial Resources/Insurance; Housing; Physical Health; Social Support; Resilience; Leisure Time ? ? ?Sleep  ?Sleep:Fair ? ?Number of hours: 6 ? ? ?No data recorded ? ?Physical Exam: ?Physical Exam ?Vitals and nursing note reviewed.  ?Constitutional:   ?   General: She is not in acute distress. ?   Appearance: Normal appearance. She is not ill-appearing.  ?HENT:  ?   Head: Normocephalic.  ?Eyes:  ?   General:     ?   Right eye: No discharge.     ?   Left eye: No discharge.  ?   Conjunctiva/sclera: Conjunctivae normal.  ?Cardiovascular:  ?   Rate and Rhythm: Normal rate.  ?Pulmonary:  ?   Effort: Pulmonary effort is normal. No respiratory distress.  ?Musculoskeletal:     ?   General: Normal range of motion.  ?   Cervical back: Normal range of motion.  ?Skin: ?   Coloration: Skin is not jaundiced or pale.  ?Neurological:  ?   Mental Status: She is alert and oriented to person, place, and time.  ?Psychiatric:     ?   Attention and Perception: Attention and perception normal.     ?   Mood and Affect: Mood is anxious and depressed. Affect is tearful.     ?   Behavior: Behavior normal. Behavior is cooperative.     ?   Thought Content: Thought content normal.     ?   Cognition and Memory: Cognition normal.  ? ?Review of Systems  ?Constitutional: Negative.   ?HENT: Negative.    ?Eyes: Negative.   ?Respiratory: Negative.    ?Cardiovascular: Negative.   ?Musculoskeletal: Negative.   ?Skin: Negative.   ?Neurological: Negative.   ?Psychiatric/Behavioral:  Positive for depression. The patient is nervous/anxious.   ?Blood pressure 120/77, pulse 87, temperature 98.5 ?F (36.9 ?C), temperature source Oral, resp. rate 18, SpO2 100 %. There is no height or weight on file to calculate BMI. ? ?Musculoskeletal: ?Strength & Muscle Tone: within normal limits ?Gait & Station:  normal ?Patient leans: N/A ? ? ?[facility] MSE Discharge Disposition for Follow up and Recommendations: ?Based on my evaluation the patient does not appear to have an emergency medical condition and can be discharged with resources and follow up care in outpatient services for Medication Management and Individual Therapy ? ?Discharge patient . ? ?Referral made to IOP/PHP program. Ardith Dark LCSW will reach out to pt with intake time)  ? ?Behavioral health outpatient services on the second floor provided, including open access walk in hours.  ? ?No evidence of imminent risk to self or others at present.    ?Patient does not meet criteria for psychiatric inpatient admission. ?Discussed crisis plan, support from social network, calling 911, coming to the Emergency Department, and calling Suicide Hotline.  ? ?Ardis Hughs, NP ?04/27/2021, 2:34 PM ? ?

## 2021-04-28 ENCOUNTER — Ambulatory Visit (HOSPITAL_COMMUNITY): Payer: No Payment, Other

## 2021-04-29 ENCOUNTER — Telehealth (HOSPITAL_COMMUNITY): Payer: Self-pay | Admitting: Licensed Clinical Social Worker

## 2021-04-30 ENCOUNTER — Ambulatory Visit (HOSPITAL_COMMUNITY): Payer: No Payment, Other

## 2021-05-13 ENCOUNTER — Telehealth (HOSPITAL_COMMUNITY): Payer: Self-pay | Admitting: Emergency Medicine

## 2021-05-13 NOTE — BH Assessment (Signed)
Care Management - [facility] Follow Up Discharges  ? ?Writer attempted to make contact with patient today and was unsuccessful.  Voicemail is not set up. ? ?Per chart review, patient was referred to the [facility]) Partial Hospitalization Program.   ?

## 2021-11-07 ENCOUNTER — Telehealth: Payer: Self-pay | Admitting: Physician Assistant

## 2021-11-07 DIAGNOSIS — B3731 Acute candidiasis of vulva and vagina: Secondary | ICD-10-CM

## 2021-11-07 MED ORDER — FLUCONAZOLE 150 MG PO TABS: 150.0000 mg | ORAL_TABLET | ORAL | 0 refills | Status: DC | PRN

## 2021-11-07 MED ORDER — TERCONAZOLE 0.4 % VA CREA: 1.0000 | TOPICAL_CREAM | Freq: Every day | VAGINAL | 0 refills | Status: DC

## 2021-11-07 NOTE — Progress Notes (Signed)
Virtual Visit Consent   Kristine Conway, you are scheduled for a virtual visit with a Kristine Conway provider today. Just as with appointments in the office, your consent must be obtained to participate. Your consent will be active for this visit and any virtual visit you may have with one of our providers in the next 365 days. If you have a MyChart account, a copy of this consent can be sent to you electronically.  As this is a virtual visit, video technology does not allow for your provider to perform a traditional examination. This may limit your provider's ability to fully assess your condition. If your provider identifies any concerns that need to be evaluated in person or the need to arrange testing (such as labs, EKG, etc.), we will make arrangements to do so. Although advances in technology are sophisticated, we cannot ensure that it will always work on either your end or our end. If the connection with a video visit is poor, the visit may have to be switched to a telephone visit. With either a video or telephone visit, we are not always able to ensure that we have a secure connection.  By engaging in this virtual visit, you consent to the provision of healthcare and authorize for your insurance to be billed (if applicable) for the services provided during this visit. Depending on your insurance coverage, you may receive a charge related to this service.  I need to obtain your verbal consent now. Are you willing to proceed with your visit today? [name] has provided verbal consent on 11/07/2021 for a virtual visit (video or telephone). Mar Daring, PA-C  Date: 11/07/2021 4:32 PM  Virtual Visit via Video Note   I, Mar Daring, connected with  Kristine Conway  ([id number], June 05, 2000) on 11/07/21 at  4:30 PM EDT by a video-enabled telemedicine application and verified that I am speaking with the correct person using two identifiers.  Location: Patient: Virtual Visit Location  Patient: Home Provider: Virtual Visit Location Provider: Home Office   I discussed the limitations of evaluation and management by telemedicine and the availability of in person appointments. The patient expressed understanding and agreed to proceed.    History of Present Illness: Kristine Conway is a 21 y.o. who identifies as a female who was assigned female at birth, and is being seen today for vaginal irritation.  HPI: Vaginal Itching The patient's primary symptoms include genital itching and vaginal discharge. The patient's pertinent negatives include no genital odor, genital rash, pelvic pain or vaginal bleeding. This is a new problem. The current episode started in the past 7 days (2-3 days ago). The problem occurs constantly. The problem has been gradually worsening. She is not pregnant. Associated symptoms include frequency. Pertinent negatives include no back pain, chills, dysuria, fever, hematuria or nausea. The vaginal discharge was white and thick. There has been no bleeding. The symptoms are aggravated by tactile pressure (cool compresses). Treatments tried: cool compresses. The treatment provided no relief.      Problems:  Patient Active Problem List   Diagnosis Date Noted   Chlamydia 10/05/2018   Menorrhagia with regular cycle 03/17/2017   Iron deficiency anemia due to chronic blood loss 03/17/2017    Allergies: No Known Allergies Medications:  Current Outpatient Medications:    fluconazole (DIFLUCAN) 150 MG tablet, Take 1 tablet (150 mg total) by mouth every 3 (three) days as needed., Disp: 2 tablet, Rfl: 0   terconazole (TERAZOL 7) 0.4 % vaginal cream, Place 1  applicator vaginally at bedtime. X 3-5 days, Disp: 45 g, Rfl: 0   benzonatate (TESSALON) 100 MG capsule, Take 1 capsule (100 mg total) by mouth every 8 (eight) hours., Disp: 21 capsule, Rfl: 0   cetirizine (ZYRTEC ALLERGY) 10 MG tablet, Take 1 tablet (10 mg total) by mouth daily., Disp: 90 tablet, Rfl: 0   ferrous  sulfate 325 (65 FE) MG tablet, Take 1 tablet (325 mg total) by mouth daily., Disp: 30 tablet, Rfl: 1   fluticasone (FLONASE) 50 MCG/ACT nasal spray, Place 2 sprays into both nostrils daily., Disp: 16 g, Rfl: 0   ipratropium (ATROVENT) 0.06 % nasal spray, Place 2 sprays into both nostrils 4 (four) times daily., Disp: 15 mL, Rfl: 0   metroNIDAZOLE (FLAGYL) 500 MG tablet, Take 1 tablet (500 mg total) by mouth 2 (two) times daily., Disp: 14 tablet, Rfl: 0   nitrofurantoin, macrocrystal-monohydrate, (MACROBID) 100 MG capsule, Take 1 capsule (100 mg total) by mouth 2 (two) times daily., Disp: 10 capsule, Rfl: 0   omeprazole (PRILOSEC) 20 MG capsule, Take 1 capsule (20 mg total) by mouth daily., Disp: 30 capsule, Rfl: 1   pseudoephedrine (SUDAFED) 30 MG tablet, Take 1 tablet (30 mg total) by mouth every 8 (eight) hours as needed for congestion., Disp: 30 tablet, Rfl: 0  Observations/Objective: Patient is well-developed, well-nourished in no acute distress.  Resting comfortably at home.  Head is normocephalic, atraumatic.  No labored breathing.  Speech is clear and coherent with logical content.  Patient is alert and oriented at baseline.    Assessment and Plan: 1. Yeast vaginitis - fluconazole (DIFLUCAN) 150 MG tablet; Take 1 tablet (150 mg total) by mouth every 3 (three) days as needed.  Dispense: 2 tablet; Refill: 0 - terconazole (TERAZOL 7) 0.4 % vaginal cream; Place 1 applicator vaginally at bedtime. X 3-5 days  Dispense: 45 g; Refill: 0  - Diflucan prescribed - Terazol cream for topical use for external irritation - Avoid bubble baths, scented soaps and lotions and scented laundry detergent/fabric softener - Seek in person evaluation if symptoms worsen or fail to improve  Follow Up Instructions: I discussed the assessment and treatment plan with the patient. The patient was provided an opportunity to ask questions and all were answered. The patient agreed with the plan and demonstrated an  understanding of the instructions.  A copy of instructions were sent to the patient via MyChart unless otherwise noted below.    The patient was advised to call back or seek an in-person evaluation if the symptoms worsen or if the condition fails to improve as anticipated.  Time:  I spent 8 minutes with the patient via telehealth technology discussing the above problems/concerns.    Margaretann Loveless, PA-C

## 2021-11-07 NOTE — Patient Instructions (Signed)
Kristine Conway, thank you for joining Mar Daring, PA-C for today's virtual visit.  While this provider is not your primary care provider (PCP), if your PCP is located in our provider database this encounter information will be shared with them immediately following your visit.  Consent: (Patient) Kristine Conway provided verbal consent for this virtual visit at the beginning of the encounter.  Current Medications:  Current Outpatient Medications:    fluconazole (DIFLUCAN) 150 MG tablet, Take 1 tablet (150 mg total) by mouth every 3 (three) days as needed., Disp: 2 tablet, Rfl: 0   terconazole (TERAZOL 7) 0.4 % vaginal cream, Place 1 applicator vaginally at bedtime. X 3-5 days, Disp: 45 g, Rfl: 0   benzonatate (TESSALON) 100 MG capsule, Take 1 capsule (100 mg total) by mouth every 8 (eight) hours., Disp: 21 capsule, Rfl: 0   cetirizine (ZYRTEC ALLERGY) 10 MG tablet, Take 1 tablet (10 mg total) by mouth daily., Disp: 90 tablet, Rfl: 0   ferrous sulfate 325 (65 FE) MG tablet, Take 1 tablet (325 mg total) by mouth daily., Disp: 30 tablet, Rfl: 1   fluticasone (FLONASE) 50 MCG/ACT nasal spray, Place 2 sprays into both nostrils daily., Disp: 16 g, Rfl: 0   ipratropium (ATROVENT) 0.06 % nasal spray, Place 2 sprays into both nostrils 4 (four) times daily., Disp: 15 mL, Rfl: 0   metroNIDAZOLE (FLAGYL) 500 MG tablet, Take 1 tablet (500 mg total) by mouth 2 (two) times daily., Disp: 14 tablet, Rfl: 0   nitrofurantoin, macrocrystal-monohydrate, (MACROBID) 100 MG capsule, Take 1 capsule (100 mg total) by mouth 2 (two) times daily., Disp: 10 capsule, Rfl: 0   omeprazole (PRILOSEC) 20 MG capsule, Take 1 capsule (20 mg total) by mouth daily., Disp: 30 capsule, Rfl: 1   pseudoephedrine (SUDAFED) 30 MG tablet, Take 1 tablet (30 mg total) by mouth every 8 (eight) hours as needed for congestion., Disp: 30 tablet, Rfl: 0   Medications ordered in this encounter:  Meds ordered this encounter  Medications    fluconazole (DIFLUCAN) 150 MG tablet    Sig: Take 1 tablet (150 mg total) by mouth every 3 (three) days as needed.    Dispense:  2 tablet    Refill:  0    Order Specific Question:   Supervising Provider    Answer:   Chase Picket [id number]   terconazole (TERAZOL 7) 0.4 % vaginal cream    Sig: Place 1 applicator vaginally at bedtime. X 3-5 days    Dispense:  45 g    Refill:  0    Order Specific Question:   Supervising Provider    Answer:   Chase Picket [[id number]]     *If you need refills on other medications prior to your next appointment, please contact your pharmacy*  Follow-Up: Call back or seek an in-person evaluation if the symptoms worsen or if the condition fails to improve as anticipated.  Other Instructions  Vaginal Yeast Infection, Adult  Vaginal yeast infection is a condition that causes vaginal discharge as well as soreness, swelling, and redness (inflammation) of the vagina. This is a common condition. Some women get this infection frequently. What are the causes? This condition is caused by a change in the normal balance of the yeast (Candida) and normal bacteria that live in the vagina. This change causes an overgrowth of yeast, which causes the inflammation. What increases the risk? The condition is more likely to develop in women who: Take antibiotic medicines. Have diabetes. Take  birth control pills. Are pregnant. Douche often. Have a weak body defense system (immune system). Have been taking steroid medicines for a long time. Frequently wear tight clothing. What are the signs or symptoms? Symptoms of this condition include: White, thick, creamy vaginal discharge. Swelling, itching, redness, and irritation of the vagina. The lips of the vagina (labia) may be affected as well. Pain or a burning feeling while urinating. Pain during sex. How is this diagnosed? This condition is diagnosed based on: Your medical history. A physical exam. A pelvic  exam. Your health care provider will examine a sample of your vaginal discharge under a microscope. Your health care provider may send this sample for testing to confirm the diagnosis. How is this treated? This condition is treated with medicine. Medicines may be over-the-counter or prescription. You may be told to use one or more of the following: Medicine that is taken by mouth (orally). Medicine that is applied as a cream (topically). Medicine that is inserted directly into the vagina (suppository). Follow these instructions at home: Take or apply over-the-counter and prescription medicines only as told by your health care provider. Do not use tampons until your health care provider approves. Do not have sex until your infection has cleared. Sex can prolong or worsen your symptoms of infection. Ask your health care provider when it is safe to resume sexual activity. Keep all follow-up visits. This is important. How is this prevented?  Do not wear tight clothes, such as pantyhose or tight pants. Wear breathable cotton underwear. Do not use douches, perfumed soap, creams, or powders. Wipe from front to back after using the toilet. If you have diabetes, keep your blood sugar levels under control. Ask your health care provider for other ways to prevent yeast infections. Contact a health care provider if: You have a fever. Your symptoms go away and then return. Your symptoms do not get better with treatment. Your symptoms get worse. You have new symptoms. You develop blisters in or around your vagina. You have blood coming from your vagina and it is not your menstrual period. You develop pain in your abdomen. Summary Vaginal yeast infection is a condition that causes discharge as well as soreness, swelling, and redness (inflammation) of the vagina. This condition is treated with medicine. Medicines may be over-the-counter or prescription. Take or apply over-the-counter and prescription  medicines only as told by your health care provider. Do not douche. Resume sexual activity or use of tampons as instructed by your health care provider. Contact a health care provider if your symptoms do not get better with treatment or your symptoms go away and then return. This information is not intended to replace advice given to you by your health care provider. Make sure you discuss any questions you have with your health care provider. Document Revised: 04/28/2020 Document Reviewed: 04/28/2020 Elsevier Patient Education  2023 Elsevier Inc.    If you have been instructed to have an in-person evaluation today at a local Urgent Care facility, please use the link below. It will take you to a list of all of our available [facility], including address, phone number and hours of operation. Please do not delay care.  [facility]  If you or a family member do not have a primary care provider, use the link below to schedule a visit and establish care. When you choose a [facility] primary care physician or advanced practice provider, you gain a long-term partner in health. Find a  Primary Care Provider  Learn more about [facility]'s in-office and virtual care options: [place] - Get Care Now

## 2021-12-18 ENCOUNTER — Other Ambulatory Visit: Payer: Self-pay

## 2021-12-18 ENCOUNTER — Encounter (HOSPITAL_BASED_OUTPATIENT_CLINIC_OR_DEPARTMENT_OTHER): Payer: Self-pay

## 2021-12-18 DIAGNOSIS — R1032 Left lower quadrant pain: Secondary | ICD-10-CM | POA: Insufficient documentation

## 2021-12-18 DIAGNOSIS — D649 Anemia, unspecified: Secondary | ICD-10-CM | POA: Insufficient documentation

## 2021-12-18 DIAGNOSIS — R1031 Right lower quadrant pain: Secondary | ICD-10-CM | POA: Insufficient documentation

## 2021-12-18 DIAGNOSIS — R1011 Right upper quadrant pain: Secondary | ICD-10-CM | POA: Insufficient documentation

## 2021-12-18 DIAGNOSIS — R1012 Left upper quadrant pain: Secondary | ICD-10-CM | POA: Insufficient documentation

## 2021-12-18 LAB — CBC
HCT: 24.9 % — ABNORMAL LOW (ref 36.0–46.0)
Hemoglobin: 7.2 g/dL — ABNORMAL LOW (ref 12.0–15.0)
MCH: 18.1 pg — ABNORMAL LOW (ref 26.0–34.0)
MCHC: 28.9 g/dL — ABNORMAL LOW (ref 30.0–36.0)
MCV: 62.6 fL — ABNORMAL LOW (ref 80.0–100.0)
Platelets: 522 10*3/uL — ABNORMAL HIGH (ref 150–400)
RBC: 3.98 MIL/uL (ref 3.87–5.11)
RDW: 20.8 % — ABNORMAL HIGH (ref 11.5–15.5)
WBC: 13.7 10*3/uL — ABNORMAL HIGH (ref 4.0–10.5)
nRBC: 0 % (ref 0.0–0.2)

## 2021-12-18 LAB — COMPREHENSIVE METABOLIC PANEL
ALT: 10 U/L (ref 0–44)
AST: 12 U/L — ABNORMAL LOW (ref 15–41)
Albumin: 4.2 g/dL (ref 3.5–5.0)
Alkaline Phosphatase: 46 U/L (ref 38–126)
Anion gap: 11 (ref 5–15)
BUN: 5 mg/dL — ABNORMAL LOW (ref 6–20)
CO2: 21 mmol/L — ABNORMAL LOW (ref 22–32)
Calcium: 9.4 mg/dL (ref 8.9–10.3)
Chloride: 103 mmol/L (ref 98–111)
Creatinine, Ser: 0.58 mg/dL (ref 0.44–1.00)
GFR, Estimated: >60 mL/min (ref >60)
Glucose, Bld: 98 mg/dL (ref 70–99)
Potassium: 3.2 mmol/L — ABNORMAL LOW (ref 3.5–5.1)
Sodium: 135 mmol/L (ref 135–145)
Total Bilirubin: 0.3 mg/dL (ref 0.3–1.2)
Total Protein: 8.8 g/dL — ABNORMAL HIGH (ref 6.5–8.1)

## 2021-12-18 LAB — LIPASE, BLOOD: Lipase: <10 U/L — ABNORMAL LOW (ref 11–51)

## 2021-12-18 NOTE — ED Triage Notes (Signed)
Pt. C/o bilateral side pain with strong smelling urine x 2 weeks, alternating sides, now on left side

## 2021-12-19 ENCOUNTER — Emergency Department (HOSPITAL_BASED_OUTPATIENT_CLINIC_OR_DEPARTMENT_OTHER)
Admission: EM | Admit: 2021-12-19 | Discharge: 2021-12-19 | Disposition: A | Discharge status: Home / Self Care | Payer: Self-pay | Attending: Emergency Medicine | Admitting: Emergency Medicine

## 2021-12-19 DIAGNOSIS — D509 Iron deficiency anemia, unspecified: Secondary | ICD-10-CM

## 2021-12-19 DIAGNOSIS — N3000 Acute cystitis without hematuria: Secondary | ICD-10-CM

## 2021-12-19 LAB — URINALYSIS, ROUTINE W REFLEX MICROSCOPIC
Bilirubin Urine: NEGATIVE
Glucose, UA: NEGATIVE mg/dL
Nitrite: POSITIVE — AB
Protein, ur: 30 mg/dL — AB
RBC / HPF: >50 RBC/hpf — ABNORMAL HIGH (ref 0–5)
Specific Gravity, Urine: 1.013 (ref 1.005–1.030)
WBC, UA: >50 WBC/hpf — ABNORMAL HIGH (ref 0–5)
pH: 6 (ref 5.0–8.0)

## 2021-12-19 LAB — PREGNANCY, URINE: Preg Test, Ur: NEGATIVE

## 2021-12-19 MED ORDER — FERROUS SULFATE 325 (65 FE) MG PO TABS: 325.0000 mg | ORAL_TABLET | Freq: Every day | ORAL | 0 refills | Status: DC

## 2021-12-19 MED ORDER — LIDOCAINE HCL (PF) 1 % IJ SOLN
INTRAMUSCULAR | Status: AC
  Administered 2021-12-19: 5 mL
  Filled 2021-12-19: qty 5

## 2021-12-19 MED ORDER — CEPHALEXIN 500 MG PO CAPS: 500.0000 mg | ORAL_CAPSULE | Freq: Three times a day (TID) | ORAL | 0 refills | Status: DC

## 2021-12-19 MED ORDER — CEFTRIAXONE SODIUM 1 G IJ SOLR
1.0000 g | Freq: Once | INTRAMUSCULAR | Status: AC
  Administered 2021-12-19: 1 g via INTRAMUSCULAR
  Filled 2021-12-19: qty 10

## 2021-12-19 NOTE — Discharge Instructions (Signed)
Begin taking Keflex as prescribed.  Begin taking iron supplement as prescribed.  Follow-up with your primary doctor in the next 2 to 3 weeks for a recheck of your blood counts, and return to the ER if symptoms significantly worsen or change.

## 2021-12-19 NOTE — ED Provider Notes (Signed)
MEDCENTER [facility] EMERGENCY DEPT Provider Note   CSN: [id number] Arrival date & time: 12/18/21  2224     History  Chief Complaint  Patient presents with   Abdominal Pain    C/o bilateral side pain x 2 weeks, also strong smelling urine    Kristine Conway is a 21 y.o. female.  Patient is a 21 year old female with past medical history of iron deficiency anemia, seasonal allergies, GERD.  Patient presenting today for evaluation of abdominal pain and strong smelling urine.  This has been worsening over the past several days.  She reports bilateral flank and abdominal pain.  She denies nausea, vomiting, or diarrhea.  She denies fevers or chills.  The history is provided by the patient.       Home Medications Prior to Admission medications   Medication Sig Start Date End Date Taking? Authorizing Provider  benzonatate (TESSALON) 100 MG capsule Take 1 capsule (100 mg total) by mouth every 8 (eight) hours. 03/08/20   Hall-Potvin, [place], PA-C  cetirizine (ZYRTEC ALLERGY) 10 MG tablet Take 1 tablet (10 mg total) by mouth daily. 05/23/20   Wallis Bamberg, PA-C  ferrous sulfate 325 (65 FE) MG tablet Take 1 tablet (325 mg total) by mouth daily. 07/31/20   Geoffery Lyons, MD  fluconazole (DIFLUCAN) 150 MG tablet Take 1 tablet (150 mg total) by mouth every 3 (three) days as needed. 11/07/21   Margaretann Loveless, PA-C  fluticasone (FLONASE) 50 MCG/ACT nasal spray Place 2 sprays into both nostrils daily. 05/23/20   Wallis Bamberg, PA-C  ipratropium (ATROVENT) 0.06 % nasal spray Place 2 sprays into both nostrils 4 (four) times daily. 07/16/19   Cathie Hoops, Amy V, PA-C  metroNIDAZOLE (FLAGYL) 500 MG tablet Take 1 tablet (500 mg total) by mouth 2 (two) times daily. 03/23/21   Lamptey, Britta Mccreedy, MD  nitrofurantoin, macrocrystal-monohydrate, (MACROBID) 100 MG capsule Take 1 capsule (100 mg total) by mouth 2 (two) times daily. 03/20/21   Gustavus Bryant, FNP  omeprazole (PRILOSEC) 20 MG capsule Take 1 capsule (20 mg  total) by mouth daily. 07/31/20   Geoffery Lyons, MD  pseudoephedrine (SUDAFED) 30 MG tablet Take 1 tablet (30 mg total) by mouth every 8 (eight) hours as needed for congestion. 05/23/20   Wallis Bamberg, PA-C  terconazole (TERAZOL 7) 0.4 % vaginal cream Place 1 applicator vaginally at bedtime. X 3-5 days 11/07/21   Margaretann Loveless, PA-C      Allergies    Patient has no known allergies.    Review of Systems   Review of Systems  All other systems reviewed and are negative.   Physical Exam Updated Vital Signs BP 130/68   Pulse 87   Temp 98.4 F (36.9 C)   Resp 16   LMP 12/18/2021   SpO2 100%  Physical Exam Vitals and nursing note reviewed.  Constitutional:      General: She is not in acute distress.    Appearance: She is well-developed. She is not diaphoretic.  HENT:     Head: Normocephalic and atraumatic.  Cardiovascular:     Rate and Rhythm: Normal rate and regular rhythm.     Heart sounds: No murmur heard.    No friction rub. No gallop.  Pulmonary:     Effort: Pulmonary effort is normal. No respiratory distress.     Breath sounds: Normal breath sounds. No wheezing.  Abdominal:     General: Bowel sounds are normal. There is no distension.     Palpations: Abdomen is  soft.     Tenderness: There is abdominal tenderness in the right upper quadrant, right lower quadrant, left upper quadrant and left lower quadrant. There is no right CVA tenderness, left CVA tenderness, guarding or rebound.  Musculoskeletal:        General: Normal range of motion.     Cervical back: Normal range of motion and neck supple.  Skin:    General: Skin is warm and dry.  Neurological:     General: No focal deficit present.     Mental Status: She is alert and oriented to person, place, and time.     ED Results / Procedures / Treatments   Labs (all labs ordered are listed, but only abnormal results are displayed) Labs Reviewed  LIPASE, BLOOD - Abnormal; Notable for the following components:       Result Value   Lipase <10 (*)    All other components within normal limits  COMPREHENSIVE METABOLIC PANEL - Abnormal; Notable for the following components:   Potassium 3.2 (*)    CO2 21 (*)    BUN 5 (*)    Total Protein 8.8 (*)    AST 12 (*)    All other components within normal limits  CBC - Abnormal; Notable for the following components:   WBC 13.7 (*)    Hemoglobin 7.2 (*)    HCT 24.9 (*)    MCV 62.6 (*)    MCH 18.1 (*)    MCHC 28.9 (*)    RDW 20.8 (*)    Platelets 522 (*)    All other components within normal limits  URINALYSIS, ROUTINE W REFLEX MICROSCOPIC  PREGNANCY, URINE    EKG None  Radiology No results found.  Procedures Procedures    Medications Ordered in ED Medications - No data to display  ED Course/ Medical Decision Making/ A&P  Patient is a 21 year old female presenting with bilateral lower abdominal discomfort/flank pain.  She also reports strong smelling urine.  Symptoms have been worsening over the past few days.  She arrives here with stable vital signs and is afebrile.  She is nontoxic in appearance.  She has mild CVA tenderness bilaterally, but abdominal exam is benign.  Work-up initiated including CBC, metabolic panel, lipase, urinalysis, and pregnancy test.  Pregnancy test is negative, but urinalysis consistent with a UTI.  Patient to be given IM Rocephin and discharged with Keflex.  Laboratory studies are reassuring with no leukocytosis, but patient noted again to have profound anemia with hemoglobin of 7.2.  She has history of iron deficiency anemia, but has not been taking iron supplements.  I will prescribe this for her and have her follow-up with her primary doctor.  Previous hemoglobin on file is 7.4.  Final Clinical Impression(s) / ED Diagnoses Final diagnoses:  None    Rx / DC Orders ED Discharge Orders     None         Veryl Speak, MD 12/19/21 [phone]

## 2022-03-16 ENCOUNTER — Telehealth: Payer: Medicaid Other | Admitting: Physician Assistant

## 2022-03-16 ENCOUNTER — Encounter: Payer: Self-pay | Admitting: Physician Assistant

## 2022-03-16 DIAGNOSIS — N9089 Other specified noninflammatory disorders of vulva and perineum: Secondary | ICD-10-CM

## 2022-03-16 NOTE — Patient Instructions (Signed)
Kristine Conway, thank you for joining Leeanne Rio, PA-C for today's virtual visit.  While this provider is not your primary care provider (PCP), if your PCP is located in our provider database this encounter information will be shared with them immediately following your visit.   [facility] account gives you access to today's visit and all your visits, tests, and labs performed at [facility] " click here if you don't have a [place] [facility] account or go to [email or web address]  Consent: (Patient) Kristine Conway provided verbal consent for this virtual visit at the beginning of the encounter.  Current Medications:  Current Outpatient Medications:    benzonatate (TESSALON) 100 MG capsule, Take 1 capsule (100 mg total) by mouth every 8 (eight) hours., Disp: 21 capsule, Rfl: 0   cephALEXin (KEFLEX) 500 MG capsule, Take 1 capsule (500 mg total) by mouth 3 (three) times daily., Disp: 21 capsule, Rfl: 0   cetirizine (ZYRTEC ALLERGY) 10 MG tablet, Take 1 tablet (10 mg total) by mouth daily., Disp: 90 tablet, Rfl: 0   ferrous sulfate 325 (65 FE) MG tablet, Take 1 tablet (325 mg total) by mouth daily., Disp: 60 tablet, Rfl: 0   fluconazole (DIFLUCAN) 150 MG tablet, Take 1 tablet (150 mg total) by mouth every 3 (three) days as needed., Disp: 2 tablet, Rfl: 0   fluticasone (FLONASE) 50 MCG/ACT nasal spray, Place 2 sprays into both nostrils daily., Disp: 16 g, Rfl: 0   ipratropium (ATROVENT) 0.06 % nasal spray, Place 2 sprays into both nostrils 4 (four) times daily., Disp: 15 mL, Rfl: 0   metroNIDAZOLE (FLAGYL) 500 MG tablet, Take 1 tablet (500 mg total) by mouth 2 (two) times daily., Disp: 14 tablet, Rfl: 0   nitrofurantoin, macrocrystal-monohydrate, (MACROBID) 100 MG capsule, Take 1 capsule (100 mg total) by mouth 2 (two) times daily., Disp: 10 capsule, Rfl: 0   omeprazole (PRILOSEC) 20 MG capsule, Take 1 capsule (20 mg total) by mouth daily., Disp: 30 capsule,  Rfl: 1   pseudoephedrine (SUDAFED) 30 MG tablet, Take 1 tablet (30 mg total) by mouth every 8 (eight) hours as needed for congestion., Disp: 30 tablet, Rfl: 0   terconazole (TERAZOL 7) 0.4 % vaginal cream, Place 1 applicator vaginally at bedtime. X 3-5 days, Disp: 45 g, Rfl: 0   Medications ordered in this encounter:  No orders of the defined types were placed in this encounter.    *If you need refills on other medications prior to your next appointment, please contact your pharmacy*  Follow-Up: Call back or seek an in-person evaluation if the symptoms worsen or if the condition fails to improve as anticipated.  [facility] [phone]  Other Instructions Keep area clean and dry. Avoid use of scented and dyed soaps, lotions in the area.  Ok to start OTC cortisone cream to the external surfaces 1-2 dx daily. If not calming down over next 48 hours, or any new/worsening symptoms, we want you to be evaluated in person ASAP.   If you have been instructed to have an in-person evaluation today at a local Urgent Care facility, please use the link below. It will take you to a list of all of our available [facility], including address, phone number and hours of operation. Please do not delay care.  [facility]  If you or a family member do not have a primary care provider, use the link below to schedule a visit and establish care.  When you choose a [facility] primary care physician or advanced practice provider, you gain a long-term partner in health. Find a Primary Care Provider  Learn more about [facility]'s in-office and virtual care options: [facility]

## 2022-03-16 NOTE — Progress Notes (Signed)
Virtual Visit Consent   Kristine Conway, you are scheduled for a virtual visit with a [facility] provider today. Just as with appointments in the office, your consent must be obtained to participate. Your consent will be active for this visit and any virtual visit you may have with one of our providers in the next 365 days. If you have a MyChart account, a copy of this consent can be sent to you electronically.  As this is a virtual visit, video technology does not allow for your provider to perform a traditional examination. This may limit your provider's ability to fully assess your condition. If your provider identifies any concerns that need to be evaluated in person or the need to arrange testing (such as labs, EKG, etc.), we will make arrangements to do so. Although advances in technology are sophisticated, we cannot ensure that it will always work on either your end or our end. If the connection with a video visit is poor, the visit may have to be switched to a telephone visit. With either a video or telephone visit, we are not always able to ensure that we have a secure connection.  By engaging in this virtual visit, you consent to the provision of healthcare and authorize for your insurance to be billed (if applicable) for the services provided during this visit. Depending on your insurance coverage, you may receive a charge related to this service.  I need to obtain your verbal consent now. Are you willing to proceed with your visit today? Kristine Conway has provided verbal consent on 03/16/2022 for a virtual visit (video or telephone). Leeanne Rio, [place]  Date: 03/16/2022 2:52 PM  Virtual Visit via Video Note   I, Leeanne Rio, connected with  Kristine Conway  ([id number], 03/04/2000) on 03/16/22 at  2:30 PM EST by a video-enabled telemedicine application and verified that I am speaking with the correct person using two identifiers.  Location: Patient: Virtual Visit Location  Patient: Mobile Provider: Virtual Visit Location Provider: Home Office   I discussed the limitations of evaluation and management by telemedicine and the availability of in person appointments. The patient expressed understanding and agreed to proceed.    History of Present Illness: Kristine Conway is a 22 y.o. who identifies as a female who was assigned female at birth, and is being seen today for some irritation of her R labia after using a new soap the other day. Notes irritation and redness, as well as some irritation around the vaginal introitus. She is sexually active but no new sexual partner. Denies concern for STI. Does not consistently use protection. Denies vaginal discharge or urinary symptoms.  HPI: HPI  Problems:  Patient Active Problem List   Diagnosis Date Noted   Chlamydia 10/05/2018   Menorrhagia with regular cycle 03/17/2017   Iron deficiency anemia due to chronic blood loss 03/17/2017    Allergies: No Known Allergies Medications:  Current Outpatient Medications:    ferrous sulfate 325 (65 FE) MG tablet, Take 1 tablet (325 mg total) by mouth daily., Disp: 60 tablet, Rfl: 0   omeprazole (PRILOSEC) 20 MG capsule, Take 1 capsule (20 mg total) by mouth daily., Disp: 30 capsule, Rfl: 1  Observations/Objective: Patient is well-developed, well-nourished in no acute distress.  Resting comfortably at home.  Head is normocephalic, atraumatic.  No labored breathing. Speech is clear and coherent with logical content.  Patient is alert and oriented at baseline.  Unable to visualize the area of concern due to lack  of chaperone, and patient in public (parked car)  Assessment and Plan: 1. Labial irritation  Unclear etiology. Could be irritant dermatitis due to new soap. Refrain from sexual activity. Keep hydrated. Use unscented and non-dyed soaps/lotions near the area. Can use OTC topical cortisone cream to external surfaces.  Will need in person evaluation if not quickly  resolving or any new/worsening symptoms.  Follow Up Instructions: I discussed the assessment and treatment plan with the patient. The patient was provided an opportunity to ask questions and all were answered. The patient agreed with the plan and demonstrated an understanding of the instructions.  A copy of instructions were sent to the patient via MyChart unless otherwise noted below.  The patient was advised to call back or seek an in-person evaluation if the symptoms worsen or if the condition fails to improve as anticipated.  Time:  I spent 10 minutes with the patient via telehealth technology discussing the above problems/concerns.    Leeanne Rio, PA-C

## 2022-03-19 ENCOUNTER — Ambulatory Visit
Admission: EM | Admit: 2022-03-19 | Discharge: 2022-03-19 | Disposition: A | Discharge status: Home / Self Care | Payer: Medicaid Other | Attending: Physician Assistant | Admitting: Physician Assistant

## 2022-03-19 DIAGNOSIS — J111 Influenza due to unidentified influenza virus with other respiratory manifestations: Secondary | ICD-10-CM | POA: Insufficient documentation

## 2022-03-19 DIAGNOSIS — R300 Dysuria: Secondary | ICD-10-CM | POA: Insufficient documentation

## 2022-03-19 DIAGNOSIS — R509 Fever, unspecified: Secondary | ICD-10-CM

## 2022-03-19 DIAGNOSIS — R109 Unspecified abdominal pain: Secondary | ICD-10-CM

## 2022-03-19 DIAGNOSIS — N3090 Cystitis, unspecified without hematuria: Secondary | ICD-10-CM | POA: Diagnosis present

## 2022-03-19 DIAGNOSIS — B009 Herpesviral infection, unspecified: Secondary | ICD-10-CM

## 2022-03-19 LAB — POCT URINALYSIS DIP (MANUAL ENTRY)
Bilirubin, UA: NEGATIVE
Glucose, UA: NEGATIVE mg/dL
Nitrite, UA: POSITIVE — AB
Protein Ur, POC: 30 mg/dL — AB
Spec Grav, UA: 1.02 (ref 1.010–1.025)
Urobilinogen, UA: 0.2 E.U./dL
pH, UA: 6.5 (ref 5.0–8.0)

## 2022-03-19 LAB — POCT URINE PREGNANCY: Preg Test, Ur: NEGATIVE

## 2022-03-19 MED ORDER — PHENAZOPYRIDINE HCL 200 MG PO TABS: 200.0000 mg | ORAL_TABLET | Freq: Three times a day (TID) | ORAL | 0 refills | Status: DC

## 2022-03-19 MED ORDER — CIPROFLOXACIN HCL 500 MG PO TABS: 500.0000 mg | ORAL_TABLET | Freq: Two times a day (BID) | ORAL | 0 refills | Status: DC

## 2022-03-19 MED ORDER — OSELTAMIVIR PHOSPHATE 75 MG PO CAPS: 75.0000 mg | ORAL_CAPSULE | Freq: Two times a day (BID) | ORAL | 0 refills | Status: DC

## 2022-03-19 MED ORDER — IBUPROFEN 800 MG PO TABS
800.0000 mg | ORAL_TABLET | Freq: Once | ORAL | Status: AC
  Administered 2022-03-19: 800 mg via ORAL

## 2022-03-19 MED ORDER — ACYCLOVIR 400 MG PO TABS: 400.0000 mg | ORAL_TABLET | Freq: Three times a day (TID) | ORAL | 1 refills | Status: DC

## 2022-03-19 MED ORDER — IBUPROFEN 600 MG PO TABS: 600.0000 mg | ORAL_TABLET | Freq: Four times a day (QID) | ORAL | 0 refills | Status: DC | PRN

## 2022-03-19 NOTE — ED Triage Notes (Signed)
Pt states that she has some abdominal pain and stuffy nose. X5 days Pt states that she also has some "sores' on her vagina.

## 2022-03-19 NOTE — Discharge Instructions (Addendum)
Advised take the ibuprofen every 6 hours on a regular basis to treat the fever.  Lab test will be completed in 48 hours.  If you do not get a call from this office that indicates the labs are negative.  Log onto MyChart to view the test results with the post in 48 hours.  Advised take the Cipro 500 mg every 12 hours until completed to treat the urinary tract infection. Advised to take Pyridium 200 mg every 8 hours to get rid of the burning on urination.  This medication will turn your urine a different color.  Advised take Tamiflu 75 mg every 12 hours to treat the flu.  Advised take the acyclovir 400 mg every 8 hours till completed to treat the sores.  Advised follow-up PCP or return to urgent care if symptoms fail to improve.

## 2022-03-19 NOTE — ED Provider Notes (Signed)
[name]    CSN: [id number] Arrival date & time: 03/19/22  1257      History   Chief Complaint Chief Complaint  Patient presents with   Abdominal Pain    Abdominal pain and stuffy nose. x5days    HPI Kristine Conway is a 22 y.o. female.   22 year old female presents with fever, chills, body aches, abdominal pain, dysuria.  Kates for the past 5 days she has been having fever of 101, chills, sweats, fatigue, body aches and pain with muscle soreness.  Patient indicates that she has had mild upper respiratory congestion with intermittent mild cough without production.  Patient indicates she has also had a sore on the left nostril that has been painful and crusting since she has had runny nose.  Patient also indicates that she has had abdominal pain which is at the lower part of the abdomen, intermittent, associated with dysuria, frequency and urgency.  She is not having any back pain.   Patient indicates she also has sore that she noticed on the genital area of the right labial minora.  She indicates this area is tender, and sore, been present for the past 3 to 4 days.  Patient indicates that she is sexually active with same-sex, and has used some stimulation items for intercourse.  But there is no history of her partner having HSV.  She indicates she has not have any vaginal discharge.  She indicates her last period was January 9 and was normal.  She is without nausea or vomiting.  She is tolerating fluids well.   Abdominal Pain   Past Medical History:  Diagnosis Date   Anemia    Medical history non-contributory    Menorrhagia with regular cycle 03/17/2017    Patient Active Problem List   Diagnosis Date Noted   Chlamydia 10/05/2018   Menorrhagia with regular cycle 03/17/2017   Iron deficiency anemia due to chronic blood loss 03/17/2017    Past Surgical History:  Procedure Laterality Date   NO PAST SURGERIES      OB History     Gravida  0   Para  0    Term  0   Preterm  0   AB  0   Living  0      SAB  0   IAB  0   Ectopic  0   Multiple  0   Live Births  0            Home Medications    Prior to Admission medications   Medication Sig Start Date End Date Taking? Authorizing Provider  acyclovir (ZOVIRAX) 400 MG tablet Take 1 tablet (400 mg total) by mouth 3 (three) times daily. 03/19/22  Yes Nyoka Lint, PA-C  ciprofloxacin (CIPRO) 500 MG tablet Take 1 tablet (500 mg total) by mouth 2 (two) times daily. 03/19/22  Yes Nyoka Lint, PA-C  ferrous sulfate 325 (65 FE) MG tablet Take 1 tablet (325 mg total) by mouth daily. 12/19/21  Yes Delo, Nathaneil Canary, MD  ibuprofen (ADVIL) 600 MG tablet Take 1 tablet (600 mg total) by mouth every 6 (six) hours as needed. 03/19/22  Yes Nyoka Lint, PA-C  omeprazole (PRILOSEC) 20 MG capsule Take 1 capsule (20 mg total) by mouth daily. 07/31/20  Yes Delo, Nathaneil Canary, MD  oseltamivir (TAMIFLU) 75 MG capsule Take 1 capsule (75 mg total) by mouth every 12 (twelve) hours. 03/19/22  Yes Nyoka Lint, PA-C  phenazopyridine (PYRIDIUM) 200 MG tablet Take 1 tablet (200  mg total) by mouth 3 (three) times daily. 03/19/22  Yes Ellsworth Lennox, PA-C    Family History Family History  Problem Relation Age of Onset   Hypertension Mother     Social History Social History   Tobacco Use   Smoking status: Never   Smokeless tobacco: Never  Vaping Use   Vaping Use: Never used  Substance Use Topics   Alcohol use: Yes    Alcohol/week: 1.0 standard drink of alcohol    Types: 1 Shots of liquor per week    Comment: occas.   Drug use: Not Currently    Comment: Last used 02/25/18     Allergies   Patient has no known allergies.   Review of Systems Review of Systems  Gastrointestinal:  Positive for abdominal pain (suprpubic area).     Physical Exam Triage Vital Signs ED Triage Vitals  Enc Vitals Group     BP 03/19/22 1503 103/64     Pulse Rate 03/19/22 1503 (!) 106     Resp 03/19/22 1503 18     Temp  03/19/22 1503 (!) 101.4 F (38.6 C)     Temp Source 03/19/22 1503 Oral     SpO2 03/19/22 1503 99 %     Weight 03/19/22 1501 100 lb (45.4 kg)     Height 03/19/22 1501 5\' 3"  (1.6 m)     Head Circumference --      Peak Flow --      Pain Score 03/19/22 1501 0     Pain Loc --      Pain Edu? --      Excl. in GC? --    No data found.  Updated Vital Signs BP 103/64 (BP Location: Left Arm)   Pulse (!) 106   Temp (!) 101.4 F (38.6 C) (Oral)   Resp 18   Ht 5\' 3"  (1.6 m)   Wt 100 lb (45.4 kg)   LMP 03/02/2022   SpO2 99%   BMI 17.71 kg/m   Visual Acuity Right Eye Distance:   Left Eye Distance:   Bilateral Distance:    Right Eye Near:   Left Eye Near:    Bilateral Near:     Physical Exam Constitutional:      Appearance: She is well-developed.  HENT:     Right Ear: Tympanic membrane and ear canal normal.     Left Ear: Tympanic membrane and ear canal normal.     Nose:      Comments: Nasal: There is mild redness and crusting at the inside lateral portion of the left nares.  There is no active drainage, there is no unusual redness, swelling, or streaking.    Mouth/Throat:     Mouth: Mucous membranes are moist.     Pharynx: Oropharynx is clear.  Cardiovascular:     Rate and Rhythm: Normal rate and regular rhythm.     Heart sounds: Normal heart sounds.  Pulmonary:     Effort: Pulmonary effort is normal.     Breath sounds: Normal breath sounds and air entry. No wheezing, rhonchi or rales.  Abdominal:     General: Abdomen is flat. Bowel sounds are normal.     Palpations: Abdomen is soft.     Tenderness: There is abdominal tenderness in the suprapubic area. There is no guarding or rebound.  Lymphadenopathy:     Cervical: No cervical adenopathy.  Neurological:     Mental Status: She is alert.      UC Treatments / Results  Labs (all labs ordered are listed, but only abnormal results are displayed) Labs Reviewed  POCT URINALYSIS DIP (MANUAL ENTRY) - Abnormal; Notable for  the following components:      Result Value   Clarity, UA cloudy (*)    Ketones, POC UA small (15) (*)    Blood, UA small (*)    Protein Ur, POC =30 (*)    Nitrite, UA Positive (*)    Leukocytes, UA Trace (*)    All other components within normal limits  HSV CULTURE AND TYPING  URINE CULTURE  POCT URINE PREGNANCY    EKG   Radiology No results found.  Procedures Procedures (including critical care time)  Medications Ordered in UC Medications  ibuprofen (ADVIL) tablet 800 mg (800 mg Oral Given 03/19/22 1524)    Initial Impression / Assessment and Plan / UC Course  I have reviewed the triage vital signs and the nursing notes.  Pertinent labs & imaging results that were available during my care of the patient were reviewed by me and considered in my medical decision making (see chart for details).    Plan: The diagnosis will be treated with the following: 1.  Fever: A.  Ibuprofen 600 mg every 6 hours with food to treat the fever. 2.  Dysuria: A.  Pyridium 200 mg every 8 hours for discomfort 3.  Cystitis: A. Cipro 500 mg every 12 hours for 7 days. B.  Pyridium 200 mg every 8 hours for burning and discomfort. 3.  Abdominal pain: A.  Radium 200 mg every 8 hours for burning and discomfort. 4.  HSV infection (genital) A.  Acyclovir 400 mg every 8 hours until completed. 5.  Flu: A.  Tamiflu 75 mg every 12 hours to treat the flu. 6.  Advised follow-up PCP or return to urgent care if symptoms fail to improve.  Final Clinical Impressions(s) / UC Diagnoses   Final diagnoses:  Fever, unspecified  Abdominal pain, unspecified abdominal location  Dysuria  HSV infection  Cystitis  Flu     Discharge Instructions      Advised take the ibuprofen every 6 hours on a regular basis to treat the fever.  Lab test will be completed in 48 hours.  If you do not get a call from this office that indicates the labs are negative.  Log onto MyChart to view the test results with the  post in 48 hours.  Advised take the Cipro 500 mg every 12 hours until completed to treat the urinary tract infection. Advised to take Pyridium 200 mg every 8 hours to get rid of the burning on urination.  This medication will turn your urine a different color.  Advised take Tamiflu 75 mg every 12 hours to treat the flu.  Advised take the acyclovir 400 mg every 8 hours till completed to treat the sores.  Advised follow-up PCP or return to urgent care if symptoms fail to improve.     ED Prescriptions     Medication Sig Dispense Auth. Provider   ibuprofen (ADVIL) 600 MG tablet Take 1 tablet (600 mg total) by mouth every 6 (six) hours as needed. 30 tablet Nyoka Lint, PA-C   acyclovir (ZOVIRAX) 400 MG tablet Take 1 tablet (400 mg total) by mouth 3 (three) times daily. 21 tablet Nyoka Lint, PA-C   ciprofloxacin (CIPRO) 500 MG tablet Take 1 tablet (500 mg total) by mouth 2 (two) times daily. 14 tablet Nyoka Lint, PA-C   phenazopyridine (PYRIDIUM) 200 MG tablet Take 1  tablet (200 mg total) by mouth 3 (three) times daily. 6 tablet Ellsworth Lennox, PA-C   oseltamivir (TAMIFLU) 75 MG capsule Take 1 capsule (75 mg total) by mouth every 12 (twelve) hours. 10 capsule Ellsworth Lennox, PA-C      PDMP not reviewed this encounter.   Ellsworth Lennox, PA-C 03/19/22 1616

## 2022-03-19 NOTE — ED Triage Notes (Signed)
Pt states that she has some pain with urination as well.

## 2022-03-21 LAB — HSV CULTURE AND TYPING

## 2022-03-22 LAB — URINE CULTURE: Culture: >=100000 — AB

## 2022-05-20 ENCOUNTER — Encounter: Payer: Medicaid Other | Admitting: Obstetrics and Gynecology

## 2022-07-23 ENCOUNTER — Telehealth: Payer: Medicaid Other

## 2022-07-23 ENCOUNTER — Telehealth: Payer: Medicaid Other | Admitting: Physician Assistant

## 2022-07-23 DIAGNOSIS — Z113 Encounter for screening for infections with a predominantly sexual mode of transmission: Secondary | ICD-10-CM

## 2022-07-23 DIAGNOSIS — Z7251 High risk heterosexual behavior: Secondary | ICD-10-CM

## 2022-07-23 DIAGNOSIS — N760 Acute vaginitis: Secondary | ICD-10-CM

## 2022-07-23 NOTE — Progress Notes (Signed)
Virtual Visit Consent   [name], you are scheduled for a virtual visit with a [facility] provider today. Just as with appointments in the office, your consent must be obtained to participate. Your consent will be active for this visit and any virtual visit you may have with one of our providers in the next 365 days. If you have a MyChart account, a copy of this consent can be sent to you electronically.  As this is a virtual visit, video technology does not allow for your provider to perform a traditional examination. This may limit your provider's ability to fully assess your condition. If your provider identifies any concerns that need to be evaluated in person or the need to arrange testing (such as labs, EKG, etc.), we will make arrangements to do so. Although advances in technology are sophisticated, we cannot ensure that it will always work on either your end or our end. If the connection with a video visit is poor, the visit may have to be switched to a telephone visit. With either a video or telephone visit, we are not always able to ensure that we have a secure connection.  By engaging in this virtual visit, you consent to the provision of healthcare and authorize for your insurance to be billed (if applicable) for the services provided during this visit. Depending on your insurance coverage, you may receive a charge related to this service.  I need to obtain your verbal consent now. Are you willing to proceed with your visit today? Kristine Conway has provided verbal consent on 07/23/2022 for a virtual visit (video or telephone). Margaretann Loveless, PA-C  Date: 07/23/2022 8:24 AM  Virtual Visit via Video Note   I, Margaretann Loveless, connected with  Kristine Conway  ([id number], 2000-08-27) on 07/23/22 at  8:15 AM EDT by a video-enabled telemedicine application and verified that I am speaking with the correct person using two identifiers.  Location: Patient: Virtual Visit Location  Patient: Home Provider: Virtual Visit Location Provider: Home Office   I discussed the limitations of evaluation and management by telemedicine and the availability of in person appointments. The patient expressed understanding and agreed to proceed.    History of Present Illness: Kristine Conway is a 22 y.o. who identifies as a female who was assigned female at birth, and is being seen today for recently having unprotected sex and now having vaginal irritation. This was with a new partner. She denies any significant discharge or odor. Mostly just significant vaginal pain and irritation. Applying ice pack to the area now.    Problems:  Patient Active Problem List   Diagnosis Date Noted   Chlamydia 10/05/2018   Menorrhagia with regular cycle 03/17/2017   Iron deficiency anemia due to chronic blood loss 03/17/2017    Allergies: No Known Allergies Medications:  Current Outpatient Medications:    acyclovir (ZOVIRAX) 400 MG tablet, Take 1 tablet (400 mg total) by mouth 3 (three) times daily., Disp: 21 tablet, Rfl: 1   ciprofloxacin (CIPRO) 500 MG tablet, Take 1 tablet (500 mg total) by mouth 2 (two) times daily., Disp: 14 tablet, Rfl: 0   ferrous sulfate 325 (65 FE) MG tablet, Take 1 tablet (325 mg total) by mouth daily., Disp: 60 tablet, Rfl: 0   ibuprofen (ADVIL) 600 MG tablet, Take 1 tablet (600 mg total) by mouth every 6 (six) hours as needed., Disp: 30 tablet, Rfl: 0   omeprazole (PRILOSEC) 20 MG capsule, Take 1 capsule (20 mg total) by  mouth daily., Disp: 30 capsule, Rfl: 1   oseltamivir (TAMIFLU) 75 MG capsule, Take 1 capsule (75 mg total) by mouth every 12 (twelve) hours., Disp: 10 capsule, Rfl: 0   phenazopyridine (PYRIDIUM) 200 MG tablet, Take 1 tablet (200 mg total) by mouth 3 (three) times daily., Disp: 6 tablet, Rfl: 0  Observations/Objective: Patient is well-developed, well-nourished in no acute distress.  Resting comfortably at home.  Head is normocephalic, atraumatic.  No  labored breathing.  Speech is clear and coherent with logical content.  Patient is alert and oriented at baseline.    Assessment and Plan: 1. Screening examination for STI  2. Unprotected sexual intercourse  3. Acute vaginitis  - Concern for STI with new partner and unprotected intercourse - Advised in person evaluation; patient agrees   Follow Up Instructions: I discussed the assessment and treatment plan with the patient. The patient was provided an opportunity to ask questions and all were answered. The patient agreed with the plan and demonstrated an understanding of the instructions.  A copy of instructions were sent to the patient via MyChart unless otherwise noted below.    The patient was advised to call back or seek an in-person evaluation if the symptoms worsen or if the condition fails to improve as anticipated.  Time:  I spent 8 minutes with the patient via telehealth technology discussing the above problems/concerns.    Margaretann Loveless, PA-C

## 2022-07-23 NOTE — Patient Instructions (Signed)
Kristine Conway, thank you for joining Margaretann Loveless, PA-C for today's virtual visit.  While this provider is not your primary care provider (PCP), if your PCP is located in our provider database this encounter information will be shared with them immediately following your visit.   A [facility] MyChart account gives you access to today's visit and all your visits, tests, and labs performed at [facility] " click here if you don't have a [facility] MyChart account or go to [email or web address]  Consent: (Patient) Kristine Conway provided verbal consent for this virtual visit at the beginning of the encounter.  Current Medications:  Current Outpatient Medications:    acyclovir (ZOVIRAX) 400 MG tablet, Take 1 tablet (400 mg total) by mouth 3 (three) times daily., Disp: 21 tablet, Rfl: 1   ciprofloxacin (CIPRO) 500 MG tablet, Take 1 tablet (500 mg total) by mouth 2 (two) times daily., Disp: 14 tablet, Rfl: 0   ferrous sulfate 325 (65 FE) MG tablet, Take 1 tablet (325 mg total) by mouth daily., Disp: 60 tablet, Rfl: 0   ibuprofen (ADVIL) 600 MG tablet, Take 1 tablet (600 mg total) by mouth every 6 (six) hours as needed., Disp: 30 tablet, Rfl: 0   omeprazole (PRILOSEC) 20 MG capsule, Take 1 capsule (20 mg total) by mouth daily., Disp: 30 capsule, Rfl: 1   oseltamivir (TAMIFLU) 75 MG capsule, Take 1 capsule (75 mg total) by mouth every 12 (twelve) hours., Disp: 10 capsule, Rfl: 0   phenazopyridine (PYRIDIUM) 200 MG tablet, Take 1 tablet (200 mg total) by mouth 3 (three) times daily., Disp: 6 tablet, Rfl: 0   Medications ordered in this encounter:  No orders of the defined types were placed in this encounter.    *If you need refills on other medications prior to your next appointment, please contact your pharmacy*  Follow-Up: Call back or seek an in-person evaluation if the symptoms worsen or if the condition fails to improve as anticipated.  [facility]  [phone]  Other Instructions Safe Sex Practicing safe sex means taking steps before and during sex to reduce your risk of: Getting an STI (sexually transmitted infection). Giving your partner an STI. Unwanted or unplanned pregnancy. How to practice safe sex Ways you can practice safe sex  Limit your sexual partners to only one partner who is having sex with only you. Avoid using alcohol and drugs before having sex. Alcohol and drugs can affect your judgment. Before having sex with a new partner: Talk to your partner about past partners, past STIs, and drug use. Get screened for STIs and discuss the results with your partner. Ask your partner to get screened too. Check your body regularly for sores, blisters, rashes, or unusual discharge. If you notice any of these problems, visit your health care provider. Avoid sexual contact if you have symptoms of an infection or you are being treated for an STI. While having sex, use a condom. Make sure to: Use a condom every time you have vaginal, oral, or anal sex. Both females and males should wear condoms during oral sex. Keep condoms in place from the beginning to the end of sexual activity. Use a latex condom, if possible. Latex condoms offer the best protection. Use only water-based lubricants with a condom. Using petroleum-based lubricants or oils will weaken the condom and increase the chance that it will break. Ways your health care provider can help you practice safe sex  See your health care provider for regular  screenings, exams, and tests for STIs. Talk with your health care provider about what kind of birth control (contraception) is best for you. Get vaccinated against hepatitis B and human papillomavirus (HPV). If you are at risk of being infected with HIV (human immunodeficiency virus), talk with your health care provider about taking a prescription medicine to prevent HIV infection. You are at risk for HIV if you: Are a man  who has sex with other men. Are sexually active with more than one partner. Take drugs by injection. Have a sex partner who has HIV. Have unprotected sex. Have sex with someone who has sex with both men and women. Have had an STI. Follow these instructions at home: Take over-the-counter and prescription medicines only as told by your health care provider. Keep all follow-up visits. This is important. Where to find more information Centers for Disease Control and Prevention: FootballExhibition.com.br Planned Parenthood: www.plannedparenthood.org Office on [employer] Health: http://hoffman.com/ Summary Practicing safe sex means taking steps before and during sex to reduce your risk getting an STI, giving your partner an STI, and having an unwanted or unplanned pregnancy. Before having sex with a new partner, talk to your partner about past partners, past STIs, and drug use. Use a condom every time you have vaginal, oral, or anal sex. Both females and males should wear condoms during oral sex. Check your body regularly for sores, blisters, rashes, or unusual discharge. If you notice any of these problems, visit your health care provider. See your health care provider for regular screenings, exams, and tests for STIs. This information is not intended to replace advice given to you by your health care provider. Make sure you discuss any questions you have with your health care provider. Document Revised: 07/16/2019 Document Reviewed: 07/16/2019 Elsevier Patient Education  2024 Elsevier Inc.    If you have been instructed to have an in-person evaluation today at a local Urgent Care facility, please use the link below. It will take you to a list of all of our available [facility], including address, phone number and hours of operation. Please do not delay care.  [facility]  If you or a family member do not have a primary care provider, use the link below to schedule a visit and  establish care. When you choose a [facility] primary care physician or advanced practice provider, you gain a long-term partner in health. Find a Primary Care Provider  Learn more about [facility]'s in-office and virtual care options:  - Get Care Now

## 2022-09-14 ENCOUNTER — Ambulatory Visit
Admission: RE | Admit: 2022-09-14 | Discharge: 2022-09-14 | Disposition: A | Discharge status: Home / Self Care | Payer: BLUE CROSS/BLUE SHIELD | Source: Ambulatory Visit | Attending: Physician Assistant | Admitting: Physician Assistant

## 2022-09-14 VITALS — BP 114/77 | HR 85 | Temp 99.2°F | Resp 17

## 2022-09-14 DIAGNOSIS — N898 Other specified noninflammatory disorders of vagina: Secondary | ICD-10-CM | POA: Diagnosis present

## 2022-09-14 DIAGNOSIS — N760 Acute vaginitis: Secondary | ICD-10-CM | POA: Insufficient documentation

## 2022-09-14 DIAGNOSIS — B9689 Other specified bacterial agents as the cause of diseases classified elsewhere: Secondary | ICD-10-CM | POA: Diagnosis not present

## 2022-09-14 NOTE — Discharge Instructions (Signed)
Return if any problems.

## 2022-09-14 NOTE — ED Triage Notes (Signed)
Pt presents with c/o vaginal itching and irritation X 1 week. States she used lavender tissue to wipe.   Denies clumpy discharge and vaginal odor.

## 2022-09-14 NOTE — ED Provider Notes (Signed)
[facility]    CSN: [id number] Arrival date & time: 09/14/22  2440      History   Chief Complaint Chief Complaint  Patient presents with   Vaginal Itching    I used lavender tissue and I didn't know until I was using it , I'm sensitive down there it's no foul smell it's just irritated & red - Entered by patient    HPI Kristine Conway is a 22 y.o. female.   Patient complains of vaginal irritation.  Patient has been using some lavender scented wipes and thinks this may be the cause.  Patient is requesting testing for STIs.  Patient denies any vaginal discharge.  She is not having any pain  The history is provided by the patient. No language interpreter was used.  Vaginal Itching This is a new problem. Nothing aggravates the symptoms. Nothing relieves the symptoms.    Past Medical History:  Diagnosis Date   Anemia    Medical history non-contributory    Menorrhagia with regular cycle 03/17/2017    Patient Active Problem List   Diagnosis Date Noted   Chlamydia 10/05/2018   Menorrhagia with regular cycle 03/17/2017   Iron deficiency anemia due to chronic blood loss 03/17/2017    Past Surgical History:  Procedure Laterality Date   NO PAST SURGERIES      OB History     Gravida  0   Para  0   Term  0   Preterm  0   AB  0   Living  0      SAB  0   IAB  0   Ectopic  0   Multiple  0   Live Births  0            Home Medications    Prior to Admission medications   Medication Sig Start Date End Date Taking? Authorizing Provider  acyclovir (ZOVIRAX) 400 MG tablet Take 1 tablet (400 mg total) by mouth 3 (three) times daily. 03/19/22   Ellsworth Lennox, PA-C  ciprofloxacin (CIPRO) 500 MG tablet Take 1 tablet (500 mg total) by mouth 2 (two) times daily. 03/19/22   Ellsworth Lennox, PA-C  ferrous sulfate 325 (65 FE) MG tablet Take 1 tablet (325 mg total) by mouth daily. 12/19/21   Geoffery Lyons, MD  ibuprofen (ADVIL) 600 MG tablet Take 1 tablet (600 mg  total) by mouth every 6 (six) hours as needed. 03/19/22   Ellsworth Lennox, PA-C  omeprazole (PRILOSEC) 20 MG capsule Take 1 capsule (20 mg total) by mouth daily. 07/31/20   Geoffery Lyons, MD  oseltamivir (TAMIFLU) 75 MG capsule Take 1 capsule (75 mg total) by mouth every 12 (twelve) hours. 03/19/22   Ellsworth Lennox, PA-C  phenazopyridine (PYRIDIUM) 200 MG tablet Take 1 tablet (200 mg total) by mouth 3 (three) times daily. 03/19/22   Ellsworth Lennox, PA-C    Family History Family History  Problem Relation Age of Onset   Hypertension Mother     Social History Social History   Tobacco Use   Smoking status: Never   Smokeless tobacco: Never  Vaping Use   Vaping status: Never Used  Substance Use Topics   Alcohol use: Yes    Alcohol/week: 1.0 standard drink of alcohol    Types: 1 Shots of liquor per week    Comment: occas.   Drug use: Not Currently    Comment: Last used 02/25/18     Allergies   Patient has no known allergies.  Review of Systems Review of Systems  All other systems reviewed and are negative.    Physical Exam Triage Vital Signs ED Triage Vitals  Encounter Vitals Group     BP 09/14/22 0822 114/77     Systolic BP Percentile --      Diastolic BP Percentile --      Pulse Rate 09/14/22 0822 85     Resp 09/14/22 0822 17     Temp 09/14/22 0822 99.2 F (37.3 C)     Temp Source 09/14/22 0822 Oral     SpO2 09/14/22 0822 100 %     Weight --      Height --      Head Circumference --      Peak Flow --      Pain Score 09/14/22 0821 0     Pain Loc --      Pain Education --      Exclude from Growth Chart --    No data found.  Updated Vital Signs BP 114/77 (BP Location: Right Arm)   Pulse 85   Temp 99.2 F (37.3 C) (Oral)   Resp 17   LMP 08/31/2022 (Exact Date)   SpO2 100%   Visual Acuity Right Eye Distance:   Left Eye Distance:   Bilateral Distance:    Right Eye Near:   Left Eye Near:    Bilateral Near:     Physical Exam Vitals and nursing note reviewed.   Constitutional:      Appearance: She is well-developed.  HENT:     Head: Normocephalic.  Pulmonary:     Effort: Pulmonary effort is normal.  Abdominal:     General: There is no distension.  Musculoskeletal:        General: Normal range of motion.     Cervical back: Normal range of motion.  Neurological:     Mental Status: She is alert and oriented to person, place, and time.      UC Treatments / Results  Labs (all labs ordered are listed, but only abnormal results are displayed) Labs Reviewed - No data to display  EKG   Radiology No results found.  Procedures Procedures (including critical care time)  Medications Ordered in UC Medications - No data to display  Initial Impression / Assessment and Plan / UC Course  I have reviewed the triage vital signs and the nursing notes.  Pertinent labs & imaging results that were available during my care of the patient were reviewed by me and considered in my medical decision making (see chart for details).     Vaginal swab ordered Final Clinical Impressions(s) / UC Diagnoses   Final diagnoses:  None     Discharge Instructions      Return if any problems.   ED Prescriptions   None    An After Visit Summary was printed and given to the patient.     PDMP not reviewed this encounter.   Elson Areas, [place] 09/14/22 [phone]

## 2022-09-15 LAB — CERVICOVAGINAL ANCILLARY ONLY
Bacterial Vaginitis (gardnerella): POSITIVE — AB
Candida Glabrata: NEGATIVE
Candida Vaginitis: NEGATIVE
Chlamydia: NEGATIVE
Comment: NEGATIVE
Comment: NEGATIVE
Comment: NEGATIVE
Comment: NEGATIVE
Comment: NEGATIVE
Comment: NORMAL
Neisseria Gonorrhea: NEGATIVE
Trichomonas: NEGATIVE

## 2022-09-16 ENCOUNTER — Telehealth: Payer: Self-pay | Admitting: Emergency Medicine

## 2022-09-16 MED ORDER — METRONIDAZOLE 500 MG PO TABS: 500.0000 mg | ORAL_TABLET | Freq: Two times a day (BID) | ORAL | 0 refills | Status: DC

## 2022-10-09 ENCOUNTER — Ambulatory Visit
Admission: EM | Admit: 2022-10-09 | Discharge: 2022-10-09 | Disposition: A | Discharge status: Home / Self Care | Payer: Medicaid Other | Attending: Internal Medicine | Admitting: Internal Medicine

## 2022-10-09 DIAGNOSIS — K602 Anal fissure, unspecified: Secondary | ICD-10-CM | POA: Diagnosis not present

## 2022-10-09 DIAGNOSIS — K5900 Constipation, unspecified: Secondary | ICD-10-CM

## 2022-10-09 DIAGNOSIS — D509 Iron deficiency anemia, unspecified: Secondary | ICD-10-CM | POA: Diagnosis not present

## 2022-10-09 DIAGNOSIS — K644 Residual hemorrhoidal skin tags: Secondary | ICD-10-CM | POA: Diagnosis not present

## 2022-10-09 MED ORDER — HYDROCORTISONE (PERIANAL) 2.5 % EX CREA: 1.0000 | TOPICAL_CREAM | Freq: Two times a day (BID) | CUTANEOUS | 0 refills | Status: DC

## 2022-10-09 MED ORDER — DOCUSATE SODIUM 100 MG PO CAPS: 100.0000 mg | ORAL_CAPSULE | Freq: Two times a day (BID) | ORAL | 0 refills | Status: AC

## 2022-10-09 NOTE — ED Triage Notes (Signed)
Pt presents with c/o rectal itching and irritation X 1 week.   States she strains when doing a bowel movement. States she has soaked in the bath and tried creams, has not had any relief.

## 2022-10-09 NOTE — ED Provider Notes (Signed)
[facility]  Note:  This document was prepared using Conservation officer, historic buildings and may include unintentional dictation errors.  MRN: [id number] DOB: [birth year]  Subjective:   Kristine Conway is a 22 y.o. female presenting for 1 week history of acute onset perianal irritation, itching, bleeding. Has a difficult time with bowel movements, constipation.  Has to strain, does not defecate every day.  Has hard stools.  Takes iron supplementation for iron deficiency anemia.  Otherwise, she tries to practice a healthy diet.  Has used over-the-counter measures with minimal relief.  No current facility-administered medications for this encounter.  Current Outpatient Medications:    acyclovir (ZOVIRAX) 400 MG tablet, Take 1 tablet (400 mg total) by mouth 3 (three) times daily., Disp: 21 tablet, Rfl: 1   ciprofloxacin (CIPRO) 500 MG tablet, Take 1 tablet (500 mg total) by mouth 2 (two) times daily., Disp: 14 tablet, Rfl: 0   ferrous sulfate 325 (65 FE) MG tablet, Take 1 tablet (325 mg total) by mouth daily., Disp: 60 tablet, Rfl: 0   ibuprofen (ADVIL) 600 MG tablet, Take 1 tablet (600 mg total) by mouth every 6 (six) hours as needed., Disp: 30 tablet, Rfl: 0   metroNIDAZOLE (FLAGYL) 500 MG tablet, Take 1 tablet (500 mg total) by mouth 2 (two) times daily., Disp: 14 tablet, Rfl: 0   omeprazole (PRILOSEC) 20 MG capsule, Take 1 capsule (20 mg total) by mouth daily., Disp: 30 capsule, Rfl: 1   oseltamivir (TAMIFLU) 75 MG capsule, Take 1 capsule (75 mg total) by mouth every 12 (twelve) hours., Disp: 10 capsule, Rfl: 0   phenazopyridine (PYRIDIUM) 200 MG tablet, Take 1 tablet (200 mg total) by mouth 3 (three) times daily., Disp: 6 tablet, Rfl: 0   No Known Allergies  Past Medical History:  Diagnosis Date   Anemia    Medical history non-contributory    Menorrhagia with regular cycle 03/17/2017     Past Surgical History:  Procedure Laterality Date   NO PAST SURGERIES       Family History  Problem Relation Age of Onset   Hypertension Mother     Social History   Tobacco Use   Smoking status: Never   Smokeless tobacco: Never  Vaping Use   Vaping status: Never Used  Substance Use Topics   Alcohol use: Yes    Alcohol/week: 1.0 standard drink of alcohol    Types: 1 Shots of liquor per week    Comment: occas.   Drug use: Not Currently    Comment: Last used 02/25/18    ROS   Objective:   Vitals: BP 109/69 (BP Location: Left Arm)   Pulse 81   Temp 98.7 F (37.1 C) (Oral)   Resp 18   LMP 10/08/2022 (Exact Date)   SpO2 99%   Physical Exam Exam conducted with a chaperone present (RT Blackstock).  Constitutional:      General: She is not in acute distress.    Appearance: Normal appearance. She is well-developed. She is not ill-appearing, toxic-appearing or diaphoretic.  HENT:     Head: Normocephalic and atraumatic.     Nose: Nose normal.     Mouth/Throat:     Mouth: Mucous membranes are moist.  Eyes:     General: No scleral icterus.       Right eye: No discharge.        Left eye: No discharge.     Extraocular Movements: Extraocular movements intact.  Cardiovascular:  Rate and Rhythm: Normal rate.  Pulmonary:     Effort: Pulmonary effort is normal.  Genitourinary:   Skin:    General: Skin is warm and dry.  Neurological:     General: No focal deficit present.     Mental Status: She is alert and oriented to person, place, and time.  Psychiatric:        Mood and Affect: Mood normal.        Behavior: Behavior normal.     Assessment and Plan :   PDMP not reviewed this encounter.  1. External hemorrhoids   2. Anal fissure   3. Constipation, unspecified constipation type   4. Iron deficiency anemia, unspecified iron deficiency anemia type    Patient is on iron supplementation and I suspect that this is contributing to the external hemorrhoids.  She has a resulting tiny anal fissure.  Conservative management recommended,  docusate, hydrocortisone rectal cream.  Discussed general management of constipation.  Counseled patient on potential for adverse effects with medications prescribed/recommended today, ER and return-to-clinic precautions discussed, patient verbalized understanding.    Wallis Bamberg, [place] 10/09/22 [phone]

## 2022-10-09 NOTE — Discharge Instructions (Addendum)
For moderate to severe constipation (not having a bowel movement in more than 3 days) then try to use an enema or Miralax once daily until you have a good bowel movement.  It is not a good idea to use an enema or laxatives daily. If you find you are doing this, then please follow up with a gastroenterologist. Otherwise, a medication you could use daily to help with promoting bowel movements is docusate (Colace) 100mg  especially since you take iron supplements. It is okay to use this 2 times daily as a stool softener to help you have good bowel movements.  Try to stay active physically including regular exercise 2-3 times a week.  Use the hydrocortisone cream for 1 week to help with the external hemorrhoids. You can also use sitz baths. Make sure you hydrate well every day with about 64 ounces of water daily (that is 2 liters).  Try to avoid carb heavy foods, dairy. This includes cutting out breads, pasta, pizza, pastries, potatoes, rice, starchy foods in general. Eat more fiber as listed below:  Salads - kale, spinach, cabbage, spring mix, arugula Fruits - avocadoes, berries (blueberries, raspberries, blackberries), apples, oranges, pomegranate, grapefruit, kiwi Vegetables - asparagus, cauliflower, broccoli, green beans, brussel sprouts, bell peppers, beets; stay away from or limit starchy vegetables like potatoes, carrots, peas Other general foods - kidney beans, egg whites, almonds, walnuts, sunflower seeds, pumpkin seeds, fat free yogurt, almond milk, flax seeds, quinoa, oats  Meat - It is better to eat lean meats and limit your red meat including pork to once a week.  Wild caught fish, chicken breast are good options as they tend to be leaner sources of good protein. Still be mindful of the sodium labels for the meats you buy.  DO NOT EAT ANY FOODS ON THIS LIST THAT YOU ARE ALLERGIC TO. For more specific needs, I highly recommend consulting a dietician or nutritionist but this can definitely be a good  starting point.

## 2022-10-15 ENCOUNTER — Telehealth: Payer: BC Managed Care – PPO | Admitting: Nurse Practitioner

## 2022-10-15 DIAGNOSIS — B3731 Acute candidiasis of vulva and vagina: Secondary | ICD-10-CM

## 2022-10-15 MED ORDER — FLUCONAZOLE 150 MG PO TABS: 150.0000 mg | ORAL_TABLET | Freq: Once | ORAL | 0 refills | Status: AC

## 2022-10-15 NOTE — Progress Notes (Signed)
Virtual Visit Consent   Kristine Conway, you are scheduled for a virtual visit with a [facility] provider today. Just as with appointments in the office, your consent must be obtained to participate. Your consent will be active for this visit and any virtual visit you may have with one of our providers in the next 365 days. If you have a MyChart account, a copy of this consent can be sent to you electronically.  As this is a virtual visit, video technology does not allow for your provider to perform a traditional examination. This may limit your provider's ability to fully assess your condition. If your provider identifies any concerns that need to be evaluated in person or the need to arrange testing (such as labs, EKG, etc.), we will make arrangements to do so. Although advances in technology are sophisticated, we cannot ensure that it will always work on either your end or our end. If the connection with a video visit is poor, the visit may have to be switched to a telephone visit. With either a video or telephone visit, we are not always able to ensure that we have a secure connection.  By engaging in this virtual visit, you consent to the provision of healthcare and authorize for your insurance to be billed (if applicable) for the services provided during this visit. Depending on your insurance coverage, you may receive a charge related to this service.  I need to obtain your verbal consent now. Are you willing to proceed with your visit today? Kristine Conway has provided verbal consent on 10/15/2022 for a virtual visit (video or telephone). Viviano Simas, FNP  Date: 10/15/2022 10:50 AM  Virtual Visit via Video Note   I, Viviano Simas, connected with  Kristine Conway  ([id number], [birth year]) on 10/15/22 at 11:00 AM EDT by a video-enabled telemedicine application and verified that I am speaking with the correct person using two identifiers.  Location: Patient: Virtual Visit Location Patient:  Home Provider: Virtual Visit Location Provider: Home Office   I discussed the limitations of evaluation and management by telemedicine and the availability of in person appointments. The patient expressed understanding and agreed to proceed.    History of Present Illness: Kristine Conway is a 22 y.o. who identifies as a female who was assigned female at birth, and is being seen today for vaginal irritation.  She is ending her menstraul cycle currently and experiencing increased vaginal itching   Denies using any new menstraul products  She denies using any new soaps Uses "sensitive" soaps only   Noted that her vaginal area is red and irritated  Denies any noted change in discharge   She is not sexually active denies STI   She was treated for BV in July at [facility]  Had resolution of symptoms at that time   She has tried some over the counter vaginal cream and that did give her relief   Problems:  Patient Active Problem List   Diagnosis Date Noted   Chlamydia 10/05/2018   Menorrhagia with regular cycle 03/17/2017   Iron deficiency anemia due to chronic blood loss 03/17/2017    Allergies: No Known Allergies Medications:  Current Outpatient Medications:    acyclovir (ZOVIRAX) 400 MG tablet, Take 1 tablet (400 mg total) by mouth 3 (three) times daily., Disp: 21 tablet, Rfl: 1   ciprofloxacin (CIPRO) 500 MG tablet, Take 1 tablet (500 mg total) by mouth 2 (two) times daily., Disp: 14 tablet, Rfl: 0   docusate sodium (COLACE) 100  MG capsule, Take 1 capsule (100 mg total) by mouth every 12 (twelve) hours., Disp: 60 capsule, Rfl: 0   ferrous sulfate 325 (65 FE) MG tablet, Take 1 tablet (325 mg total) by mouth daily., Disp: 60 tablet, Rfl: 0   hydrocortisone (ANUSOL-HC) 2.5 % rectal cream, Place 1 Application rectally 2 (two) times daily., Disp: 30 g, Rfl: 0   ibuprofen (ADVIL) 600 MG tablet, Take 1 tablet (600 mg total) by mouth every 6 (six) hours as needed., Disp: 30 tablet, Rfl: 0    metroNIDAZOLE (FLAGYL) 500 MG tablet, Take 1 tablet (500 mg total) by mouth 2 (two) times daily., Disp: 14 tablet, Rfl: 0   omeprazole (PRILOSEC) 20 MG capsule, Take 1 capsule (20 mg total) by mouth daily., Disp: 30 capsule, Rfl: 1   oseltamivir (TAMIFLU) 75 MG capsule, Take 1 capsule (75 mg total) by mouth every 12 (twelve) hours., Disp: 10 capsule, Rfl: 0   phenazopyridine (PYRIDIUM) 200 MG tablet, Take 1 tablet (200 mg total) by mouth 3 (three) times daily., Disp: 6 tablet, Rfl: 0  Observations/Objective: Patient is well-developed, well-nourished in no acute distress.  Resting comfortably  at home.  Head is normocephalic, atraumatic.  No labored breathing.  Speech is clear and coherent with logical content.  Patient is alert and oriented at baseline.    Assessment and Plan:  1. Vaginal yeast infection  - fluconazole (DIFLUCAN) 150 MG tablet; Take 1 tablet (150 mg total) by mouth once for 1 dose.  Dispense: 1 tablet; Refill: 0    May continue cream as needed  Keep area dry  Avoid scented soaps    Follow Up Instructions: I discussed the assessment and treatment plan with the patient. The patient was provided an opportunity to ask questions and all were answered. The patient agreed with the plan and demonstrated an understanding of the instructions.  A copy of instructions were sent to the patient via MyChart unless otherwise noted below.    The patient was advised to call back or seek an in-person evaluation if the symptoms worsen or if the condition fails to improve as anticipated.  Time:  I spent 10 minutes with the patient via telehealth technology discussing the above problems/concerns.    Viviano Simas, FNP

## 2022-10-19 ENCOUNTER — Ambulatory Visit: Payer: Medicaid Other

## 2022-10-19 ENCOUNTER — Other Ambulatory Visit: Payer: Self-pay

## 2022-10-19 ENCOUNTER — Encounter (HOSPITAL_COMMUNITY): Payer: Self-pay | Admitting: *Deleted

## 2022-10-19 ENCOUNTER — Emergency Department (HOSPITAL_COMMUNITY)
Admission: EM | Admit: 2022-10-19 | Discharge: 2022-10-19 | Disposition: A | Discharge status: Home / Self Care | Payer: BC Managed Care – PPO | Attending: Emergency Medicine | Admitting: Emergency Medicine

## 2022-10-19 DIAGNOSIS — N898 Other specified noninflammatory disorders of vagina: Secondary | ICD-10-CM | POA: Diagnosis present

## 2022-10-19 DIAGNOSIS — N3000 Acute cystitis without hematuria: Secondary | ICD-10-CM

## 2022-10-19 LAB — WET PREP, GENITAL
Clue Cells Wet Prep HPF POC: NONE SEEN
Sperm: NONE SEEN
Trich, Wet Prep: NONE SEEN
WBC, Wet Prep HPF POC: >=10 — AB (ref <10)
Yeast Wet Prep HPF POC: NONE SEEN

## 2022-10-19 LAB — URINALYSIS, ROUTINE W REFLEX MICROSCOPIC
Bilirubin Urine: NEGATIVE
Glucose, UA: NEGATIVE mg/dL
Hgb urine dipstick: NEGATIVE
Ketones, ur: 5 mg/dL — AB
Nitrite: POSITIVE — AB
Protein, ur: 100 mg/dL — AB
Specific Gravity, Urine: 1.034 — ABNORMAL HIGH (ref 1.005–1.030)
WBC, UA: >50 WBC/hpf (ref 0–5)
pH: 5 (ref 5.0–8.0)

## 2022-10-19 LAB — PREGNANCY, URINE: Preg Test, Ur: NEGATIVE

## 2022-10-19 MED ORDER — CEPHALEXIN 500 MG PO CAPS: 500.0000 mg | ORAL_CAPSULE | Freq: Three times a day (TID) | ORAL | 0 refills | Status: DC

## 2022-10-19 MED ORDER — CEPHALEXIN 500 MG PO CAPS
500.0000 mg | ORAL_CAPSULE | Freq: Once | ORAL | Status: AC
  Administered 2022-10-19: 500 mg via ORAL
  Filled 2022-10-19: qty 1

## 2022-10-19 MED ORDER — IBUPROFEN 800 MG PO TABS
800.0000 mg | ORAL_TABLET | Freq: Once | ORAL | Status: AC
  Administered 2022-10-19: 800 mg via ORAL
  Filled 2022-10-19: qty 1

## 2022-10-19 NOTE — Discharge Instructions (Signed)
Follow your results on MyChart later today.  Take the medication for a UTI.  Follow-up with your doctor.  Return to the ED with any worsening symptoms.

## 2022-10-19 NOTE — ED Provider Notes (Signed)
[place] EMERGENCY DEPARTMENT AT [facility] Provider Note   CSN: [id number] Arrival date & time: 10/19/22  0150     History  Chief Complaint  Patient presents with   Vaginal Itching    Kristine Conway is a 22 y.o. female.  Patient concern for "vaginal irritation" for the past 4 days.  States her labia are red and swollen.  She is having discomfort but no discharge.  She had a virtual visit was prescribed Diflucan as well as a cream without relief.  Denies pain with urination, blood in the urine, fever, chills, nausea, vomiting, abdominal pain.  Denies any recent sexual activity. No flank pain or back pain.  No vaginal discharge or bleeding.  Does not believe she is pregnant.  The history is provided by the patient.  Vaginal Itching Pertinent negatives include no headaches and no shortness of breath.       Home Medications Prior to Admission medications   Medication Sig Start Date End Date Taking? Authorizing Provider  acyclovir (ZOVIRAX) 400 MG tablet Take 1 tablet (400 mg total) by mouth 3 (three) times daily. 03/19/22   Ellsworth Lennox, PA-C  ciprofloxacin (CIPRO) 500 MG tablet Take 1 tablet (500 mg total) by mouth 2 (two) times daily. 03/19/22   Ellsworth Lennox, PA-C  docusate sodium (COLACE) 100 MG capsule Take 1 capsule (100 mg total) by mouth every 12 (twelve) hours. 10/09/22   Wallis Bamberg, PA-C  ferrous sulfate 325 (65 FE) MG tablet Take 1 tablet (325 mg total) by mouth daily. 12/19/21   Geoffery Lyons, MD  hydrocortisone (ANUSOL-HC) 2.5 % rectal cream Place 1 Application rectally 2 (two) times daily. 10/09/22   Wallis Bamberg, PA-C  ibuprofen (ADVIL) 600 MG tablet Take 1 tablet (600 mg total) by mouth every 6 (six) hours as needed. 03/19/22   Ellsworth Lennox, PA-C  metroNIDAZOLE (FLAGYL) 500 MG tablet Take 1 tablet (500 mg total) by mouth 2 (two) times daily. 09/16/22   Merrilee Jansky, MD  omeprazole (PRILOSEC) 20 MG capsule Take 1 capsule (20 mg total) by mouth daily.  07/31/20   Geoffery Lyons, MD  oseltamivir (TAMIFLU) 75 MG capsule Take 1 capsule (75 mg total) by mouth every 12 (twelve) hours. 03/19/22   Ellsworth Lennox, PA-C  phenazopyridine (PYRIDIUM) 200 MG tablet Take 1 tablet (200 mg total) by mouth 3 (three) times daily. 03/19/22   Ellsworth Lennox, PA-C      Allergies    Patient has no known allergies.    Review of Systems   Review of Systems  Constitutional:  Negative for activity change, appetite change and fever.  HENT:  Negative for congestion.   Respiratory:  Negative for cough, chest tightness and shortness of breath.   Gastrointestinal:  Negative for vomiting.  Genitourinary:  Positive for vaginal discharge and vaginal pain. Negative for dysuria and hematuria.  Musculoskeletal:  Negative for arthralgias, back pain and myalgias.  Skin:  Negative for rash.  Neurological:  Negative for dizziness, weakness and headaches.   all other systems are negative except as noted in the HPI and PMH.    Physical Exam Updated Vital Signs BP 131/84 (BP Location: Right Arm)   Pulse 67   Temp 98.5 F (36.9 C) (Oral)   Resp 16   LMP 10/08/2022 (Exact Date)   SpO2 100%  Physical Exam Vitals and nursing note reviewed.  Constitutional:      General: She is not in acute distress.    Appearance: She is well-developed.  HENT:  Head: Normocephalic and atraumatic.     Mouth/Throat:     Pharynx: No oropharyngeal exudate.  Eyes:     Conjunctiva/sclera: Conjunctivae normal.     Pupils: Pupils are equal, round, and reactive to light.  Neck:     Comments: No meningismus. Cardiovascular:     Rate and Rhythm: Normal rate and regular rhythm.     Heart sounds: Normal heart sounds. No murmur heard. Pulmonary:     Effort: Pulmonary effort is normal. No respiratory distress.     Breath sounds: Normal breath sounds.  Abdominal:     Palpations: Abdomen is soft.     Tenderness: There is no abdominal tenderness. There is no guarding or rebound.  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  Labia are slightly swollen and erythematous but no fluctuance.  White discharge in vaginal vault.  No CMT.  No lateralizing adnexal tenderness. Musculoskeletal:        General: No tenderness. Normal range of motion.     Cervical back: Normal range of motion and neck supple.  Skin:    General: Skin is warm.  Neurological:     Mental Status: She is alert and oriented to person, place, and time.     Cranial Nerves: No cranial nerve deficit.     Motor: No abnormal muscle tone.     Coordination: Coordination normal.     Comments:  5/5 strength throughout. CN 2-12 intact.Equal grip strength.   Psychiatric:        Behavior: Behavior normal.     ED Results / Procedures / Treatments   Labs (all labs ordered are listed, but only abnormal results are displayed) Labs Reviewed  WET PREP, GENITAL - Abnormal; Notable for the following components:      Result Value   WBC, Wet Prep HPF POC >=10 (*)    All other components within normal limits  URINALYSIS, ROUTINE W REFLEX MICROSCOPIC - Abnormal; Notable for the following components:   APPearance HAZY (*)    Specific Gravity, Urine 1.034 (*)    Ketones, ur 5 (*)    Protein, ur 100 (*)    Nitrite POSITIVE (*)    Leukocytes,Ua LARGE (*)    Bacteria, UA MANY (*)    All other components within normal limits  URINE CULTURE  PREGNANCY, URINE  GC/CHLAMYDIA PROBE AMP ([place]) NOT AT [facility]    EKG None  Radiology No results found.  Procedures Procedures    Medications Ordered in ED Medications - No data to display  ED Course/ Medical Decision Making/ A&P                                 Medical Decision Making Amount and/or Complexity of Data Reviewed Labs: ordered. Decision-making details documented in ED Course. Radiology: ordered and independent interpretation performed. Decision-making details documented in ED Course. ECG/medicine tests: ordered and independent interpretation performed.  Decision-making details documented in ED Course.  Risk Prescription drug management.   4 days of vaginal irritation.  Vital stable, no distress.  Abdomen soft without peritoneal signs.  HCG is negative.  Urinalysis positive for infection.  Pelvic exam as above does show labial irritation.  Wet prep and GC chlamydia swab sent.  No yeast or BV seen on wet prep.  Will treat UTI. No evidence of PID. No evidence of pyelonephritis.  Doubt infected kidney stone.  Follow up with PCP as well as  gynecology.  Patient instructed to follow-up for results on MyChart later today.  Return precautions discussed.        Final Clinical Impression(s) / ED Diagnoses Final diagnoses:  None    Rx / DC Orders ED Discharge Orders     None         Naijah Lacek, Jeannett Senior, MD 10/19/22 [phone]

## 2022-10-19 NOTE — ED Triage Notes (Signed)
Pt reports vaginal itching and irritation for 4 days, was given diflucan and vaginal cream via virtual visit without relief. Denies vaginal discharge. LMP 10/07/21

## 2022-10-19 NOTE — ED Notes (Signed)
Called lab to add on urine culture ?

## 2022-10-19 NOTE — ED Notes (Signed)
Pt given crackers and gingerale prior to antibiotic admin

## 2022-10-20 LAB — GC/CHLAMYDIA PROBE AMP (~~LOC~~) NOT AT ARMC
Chlamydia: NEGATIVE
Comment: NEGATIVE
Comment: NORMAL
Neisseria Gonorrhea: NEGATIVE

## 2022-10-21 LAB — URINE CULTURE: Culture: >=100000 — AB

## 2022-10-22 ENCOUNTER — Telehealth (HOSPITAL_BASED_OUTPATIENT_CLINIC_OR_DEPARTMENT_OTHER): Payer: Self-pay | Admitting: *Deleted

## 2022-10-22 NOTE — Telephone Encounter (Signed)
Post ED Visit - Positive Culture Follow-up  Culture report reviewed by antimicrobial stewardship pharmacist: [facility] []  Enzo Bi, Pharm.D. []  Celedonio Miyamoto, Pharm.D., BCPS AQ-ID []  Garvin Fila, Pharm.D., BCPS []  Georgina Pillion, Pharm.D., BCPS []  Stella, [place].D., BCPS, AAHIVP []  Estella Husk, Pharm.D., BCPS, AAHIVP []  Lysle Pearl, PharmD, BCPS []  Phillips Climes, PharmD, BCPS []  Agapito Games, PharmD, BCPS []  Verlan Friends, PharmD []  Mervyn Gay, PharmD, BCPS []  Vinnie Level, PharmD  [facility] [x]  Sharin Mons, PharmD []  Greer Pickerel, PharmD []  Adalberto Cole, PharmD []  Perlie Gold, Rph []  Lonell Face) Jean Rosenthal, PharmD []  Earl Many, PharmD []  Junita Push, PharmD []  Dorna Leitz, PharmD []  Terrilee Files, PharmD []  Lynann Beaver, PharmD []  Keturah Barre, PharmD []  Loralee Pacas, PharmD []  Bernadene Person, PharmD   Positive urine culture Treated with Cephalexin, organism sensitive to the same and no further patient follow-up is required at this time.  Bing Quarry 10/22/2022, 10:36 AM

## 2023-02-07 ENCOUNTER — Encounter: Payer: Self-pay | Admitting: Emergency Medicine

## 2023-02-07 ENCOUNTER — Other Ambulatory Visit: Payer: Self-pay

## 2023-02-07 ENCOUNTER — Ambulatory Visit
Admission: EM | Admit: 2023-02-07 | Discharge: 2023-02-07 | Disposition: A | Discharge status: Home / Self Care | Payer: BC Managed Care – PPO | Attending: Family Medicine | Admitting: Family Medicine

## 2023-02-07 DIAGNOSIS — J029 Acute pharyngitis, unspecified: Secondary | ICD-10-CM | POA: Insufficient documentation

## 2023-02-07 LAB — POCT RAPID STREP A (OFFICE): Rapid Strep A Screen: NEGATIVE

## 2023-02-07 LAB — POCT MONO SCREEN (KUC): Mono, POC: NEGATIVE

## 2023-02-07 NOTE — ED Triage Notes (Signed)
Pt here for sore throat x 1 week 

## 2023-02-07 NOTE — ED Provider Notes (Signed)
[name]    CSN: [id number] Arrival date & time: 02/07/23  1343      History   Chief Complaint Chief Complaint  Patient presents with   Sore Throat    HPI Kristine Conway is a 22 y.o. female.   Patient here today for evaluation of sore throat she has had for the last week.  She reports more pain to her left side of her throat.  She notes swallowing worsens pain.  She has not had any vomiting.  She denies any fever.  She denies any concerns for STDs.  The history is provided by the patient.  Sore Throat Pertinent negatives include no abdominal pain and no shortness of breath.    Past Medical History:  Diagnosis Date   Anemia    Medical history non-contributory    Menorrhagia with regular cycle 03/17/2017    Patient Active Problem List   Diagnosis Date Noted   Chlamydia 10/05/2018   Menorrhagia with regular cycle 03/17/2017   Iron deficiency anemia due to chronic blood loss 03/17/2017    Past Surgical History:  Procedure Laterality Date   NO PAST SURGERIES      OB History     Gravida  0   Para  0   Term  0   Preterm  0   AB  0   Living  0      SAB  0   IAB  0   Ectopic  0   Multiple  0   Live Births  0            Home Medications    Prior to Admission medications   Medication Sig Start Date End Date Taking? Authorizing Provider  acyclovir (ZOVIRAX) 400 MG tablet Take 1 tablet (400 mg total) by mouth 3 (three) times daily. 03/19/22   Ellsworth Lennox, PA-C  cephALEXin (KEFLEX) 500 MG capsule Take 1 capsule (500 mg total) by mouth 3 (three) times daily. Patient not taking: Reported on 02/07/2023 10/19/22   Glynn Octave, MD  ciprofloxacin (CIPRO) 500 MG tablet Take 1 tablet (500 mg total) by mouth 2 (two) times daily. Patient not taking: Reported on 02/07/2023 03/19/22   Ellsworth Lennox, PA-C  docusate sodium (COLACE) 100 MG capsule Take 1 capsule (100 mg total) by mouth every 12 (twelve) hours. 10/09/22   Wallis Bamberg, PA-C   ferrous sulfate 325 (65 FE) MG tablet Take 1 tablet (325 mg total) by mouth daily. 12/19/21   Geoffery Lyons, MD  hydrocortisone (ANUSOL-HC) 2.5 % rectal cream Place 1 Application rectally 2 (two) times daily. 10/09/22   Wallis Bamberg, PA-C  ibuprofen (ADVIL) 600 MG tablet Take 1 tablet (600 mg total) by mouth every 6 (six) hours as needed. 03/19/22   Ellsworth Lennox, PA-C  metroNIDAZOLE (FLAGYL) 500 MG tablet Take 1 tablet (500 mg total) by mouth 2 (two) times daily. Patient not taking: Reported on 02/07/2023 09/16/22   Merrilee Jansky, MD  omeprazole (PRILOSEC) 20 MG capsule Take 1 capsule (20 mg total) by mouth daily. 07/31/20   Geoffery Lyons, MD  oseltamivir (TAMIFLU) 75 MG capsule Take 1 capsule (75 mg total) by mouth every 12 (twelve) hours. Patient not taking: Reported on 02/07/2023 03/19/22   Ellsworth Lennox, PA-C  phenazopyridine (PYRIDIUM) 200 MG tablet Take 1 tablet (200 mg total) by mouth 3 (three) times daily. 03/19/22   Ellsworth Lennox, PA-C    Family History Family History  Problem Relation Age of Onset   Hypertension Mother  Social History Social History   Tobacco Use   Smoking status: Never   Smokeless tobacco: Never  Vaping Use   Vaping status: Never Used  Substance Use Topics   Alcohol use: Yes    Alcohol/week: 1.0 standard drink of alcohol    Types: 1 Shots of liquor per week    Comment: occas.   Drug use: Not Currently    Comment: Last used 02/25/18     Allergies   Patient has no known allergies.   Review of Systems Review of Systems  Constitutional:  Negative for chills and fever.  HENT:  Positive for sore throat. Negative for congestion, ear pain and sinus pressure.   Eyes:  Negative for discharge and redness.  Respiratory:  Negative for cough, shortness of breath and wheezing.   Gastrointestinal:  Negative for abdominal pain, diarrhea, nausea and vomiting.     Physical Exam Triage Vital Signs ED Triage Vitals [02/07/23 1529]  Encounter Vitals Group      BP 127/88     Systolic BP Percentile      Diastolic BP Percentile      Pulse Rate 81     Resp 18     Temp 98.3 F (36.8 C)     Temp Source Oral     SpO2 99 %     Weight      Height      Head Circumference      Peak Flow      Pain Score 5     Pain Loc      Pain Education      Exclude from Growth Chart    No data found.  Updated Vital Signs BP 127/88 (BP Location: Right Arm)   Pulse 81   Temp 98.3 F (36.8 C) (Oral)   Resp 18   SpO2 99%      Physical Exam Vitals and nursing note reviewed.  Constitutional:      General: She is not in acute distress.    Appearance: Normal appearance. She is not ill-appearing.  HENT:     Head: Normocephalic and atraumatic.     Nose: No congestion.     Mouth/Throat:     Mouth: Mucous membranes are moist.     Pharynx: Posterior oropharyngeal erythema present. No oropharyngeal exudate.  Eyes:     Conjunctiva/sclera: Conjunctivae normal.  Cardiovascular:     Rate and Rhythm: Normal rate and regular rhythm.     Heart sounds: Normal heart sounds. No murmur heard. Pulmonary:     Effort: Pulmonary effort is normal. No respiratory distress.     Breath sounds: Normal breath sounds. No wheezing, rhonchi or rales.  Skin:    General: Skin is warm and dry.  Neurological:     Mental Status: She is alert.  Psychiatric:        Mood and Affect: Mood normal.        Thought Content: Thought content normal.      UC Treatments / Results  Labs (all labs ordered are listed, but only abnormal results are displayed) Labs Reviewed  POCT RAPID STREP A (OFFICE) - Normal  POCT MONO SCREEN ([facility]) - Normal  CULTURE, GROUP A STREP [facility])    EKG   Radiology No results found.  Procedures Procedures (including critical care time)  Medications Ordered in UC Medications - No data to display  Initial Impression / Assessment and Plan / UC Course  I have reviewed the triage vital signs and the nursing notes.  Pertinent labs & imaging results that  were available during my care of the patient were reviewed by me and considered in my medical decision making (see chart for details).    Point-of-care strep and mono screening negative.  Will order throat culture.  Recommended symptomatic treatment, increase fluids and rest and follow-up if no gradual improvement with any further concerns.  Final Clinical Impressions(s) / UC Diagnoses   Final diagnoses:  Acute pharyngitis, unspecified etiology     Discharge Instructions       Rapid strep and mono screening negative. Recommend follow up with PCP if no gradual improvement or sooner in ED with any worsening.    ED Prescriptions   None    PDMP not reviewed this encounter.   Tomi Bamberger, PA-C 02/07/23 1630

## 2023-02-07 NOTE — Discharge Instructions (Addendum)
  Rapid strep and mono screening negative. Recommend follow up with PCP if no gradual improvement or sooner in ED with any worsening.

## 2023-02-10 LAB — CULTURE, GROUP A STREP (THRC)

## 2023-04-15 ENCOUNTER — Encounter: Payer: Self-pay | Admitting: *Deleted

## 2023-04-15 ENCOUNTER — Ambulatory Visit
Admission: RE | Admit: 2023-04-15 | Discharge: 2023-04-15 | Disposition: A | Discharge status: Home / Self Care | Payer: BC Managed Care – PPO | Source: Ambulatory Visit

## 2023-04-15 ENCOUNTER — Other Ambulatory Visit: Payer: Self-pay

## 2023-04-15 ENCOUNTER — Ambulatory Visit
Admission: RE | Admit: 2023-04-15 | Discharge: 2023-04-15 | Disposition: A | Discharge status: Home / Self Care | Payer: BC Managed Care – PPO | Source: Ambulatory Visit | Attending: Physician Assistant | Admitting: Physician Assistant

## 2023-04-15 DIAGNOSIS — Z113 Encounter for screening for infections with a predominantly sexual mode of transmission: Secondary | ICD-10-CM | POA: Insufficient documentation

## 2023-04-15 DIAGNOSIS — N898 Other specified noninflammatory disorders of vagina: Secondary | ICD-10-CM | POA: Insufficient documentation

## 2023-04-15 NOTE — ED Triage Notes (Signed)
 Pt reports "cut" in her vaginal area that bothers her. Not too painful but "bothers me that it is there". Denies known injury

## 2023-04-15 NOTE — ED Provider Notes (Signed)
 [name]    CSN: [id number] Arrival date & time: 04/15/23  1358      History   Chief Complaint Chief Complaint  Patient presents with   Vaginal Pain    HPI Kristine Conway is a 23 y.o. female.   Patient presents today with a 5 to 6-day history of painful lesion of her vulva.  She denies any changes to personal hygiene products including soaps or detergents.  She denies any associated discharge, pelvic pain, vomiting, fever, nausea, vomiting.  Her last sexual encounter was on 04/08/2023 and was protected.  Denies any changes to condom brand.  She reports that this is uncomfortable but not particularly painful.  She denies history of HSV.  She has no concern for pregnancy.  She is concerned because the area has not healed.  She has not been trying any over-the-counter medications for symptom management.    Past Medical History:  Diagnosis Date   Anemia    Medical history non-contributory    Menorrhagia with regular cycle 03/17/2017    Patient Active Problem List   Diagnosis Date Noted   Chlamydia 10/05/2018   Menorrhagia with regular cycle 03/17/2017   Iron deficiency anemia due to chronic blood loss 03/17/2017    Past Surgical History:  Procedure Laterality Date   NO PAST SURGERIES      OB History     Gravida  0   Para  0   Term  0   Preterm  0   AB  0   Living  0      SAB  0   IAB  0   Ectopic  0   Multiple  0   Live Births  0            Home Medications    Prior to Admission medications   Medication Sig Start Date End Date Taking? Authorizing Provider  ferrous sulfate 325 (65 FE) MG tablet Take 1 tablet (325 mg total) by mouth daily. 12/19/21  Yes Delo, Riley Lam, MD  acyclovir (ZOVIRAX) 400 MG tablet Take 1 tablet (400 mg total) by mouth 3 (three) times daily. 03/19/22   Ellsworth Lennox, PA-C  docusate sodium (COLACE) 100 MG capsule Take 1 capsule (100 mg total) by mouth every 12 (twelve) hours. 10/09/22   Wallis Bamberg, PA-C   hydrocortisone (ANUSOL-HC) 2.5 % rectal cream Place 1 Application rectally 2 (two) times daily. 10/09/22   Wallis Bamberg, PA-C  omeprazole (PRILOSEC) 20 MG capsule Take 1 capsule (20 mg total) by mouth daily. 07/31/20   Geoffery Lyons, MD    Family History Family History  Problem Relation Age of Onset   Hypertension Mother     Social History Social History   Tobacco Use   Smoking status: Never   Smokeless tobacco: Never  Vaping Use   Vaping status: Never Used  Substance Use Topics   Alcohol use: Yes    Alcohol/week: 1.0 standard drink of alcohol    Types: 1 Shots of liquor per week    Comment: occas.   Drug use: Not Currently    Comment: Last used 02/25/18     Allergies   Patient has no known allergies.   Review of Systems Review of Systems  Constitutional:  Positive for activity change. Negative for appetite change, fatigue and fever.  Gastrointestinal:  Negative for abdominal pain, diarrhea, nausea and vomiting.  Genitourinary:  Positive for vaginal pain. Negative for dysuria, frequency, genital sores, pelvic pain, urgency, vaginal bleeding and vaginal  discharge.     Physical Exam Triage Vital Signs ED Triage Vitals  Encounter Vitals Group     BP 04/15/23 1423 119/75     Systolic BP Percentile --      Diastolic BP Percentile --      Pulse Rate 04/15/23 1423 75     Resp 04/15/23 1423 16     Temp 04/15/23 1423 98.1 F (36.7 C)     Temp Source 04/15/23 1423 Oral     SpO2 04/15/23 1423 100 %     Weight --      Height --      Head Circumference --      Peak Flow --      Pain Score 04/15/23 1420 2     Pain Loc --      Pain Education --      Exclude from Growth Chart --    No data found.  Updated Vital Signs BP 119/75 (BP Location: Left Arm)   Pulse 75   Temp 98.1 F (36.7 C) (Oral)   Resp 16   LMP 03/16/2023   SpO2 100%   Visual Acuity Right Eye Distance:   Left Eye Distance:   Bilateral Distance:    Right Eye Near:   Left Eye Near:    Bilateral  Near:     Physical Exam Vitals reviewed. Exam conducted with a chaperone present.  Constitutional:      General: She is awake. She is not in acute distress.    Appearance: Normal appearance. She is well-developed. She is not ill-appearing.     Comments: Very pleasant female appears stated age in no acute distress sitting comfortably in exam room  HENT:     Head: Normocephalic and atraumatic.  Cardiovascular:     Rate and Rhythm: Normal rate and regular rhythm.     Heart sounds: Normal heart sounds, S1 normal and S2 normal. No murmur heard. Pulmonary:     Effort: Pulmonary effort is normal.     Breath sounds: Normal breath sounds. No wheezing, rhonchi or rales.     Comments: Clear to auscultation bilaterally Abdominal:     General: Bowel sounds are normal.     Palpations: Abdomen is soft.     Tenderness: There is no abdominal tenderness. There is no right CVA tenderness, left CVA tenderness, guarding or rebound.  Genitourinary:    Labia:        Right: Lesion present. No injury.        Left: No lesion or injury.      Vagina: Vaginal discharge present.     Cervix: No cervical motion tenderness.     Uterus: Normal.      Adnexa: Right adnexa normal and left adnexa normal.       Right: No mass.         Left: No mass.         Comments: Marisue Ivan, PA present as chaperone during exam.  Copious thin white discharge noted posterior vaginal wall. Psychiatric:        Behavior: Behavior is cooperative.      UC Treatments / Results  Labs (all labs ordered are listed, but only abnormal results are displayed) Labs Reviewed  HSV CULTURE AND TYPING  CERVICOVAGINAL ANCILLARY ONLY    EKG   Radiology No results found.  Procedures Procedures (including critical care time)  Medications Ordered in UC Medications - No data to display  Initial Impression / Assessment and Plan / UC Course  I have reviewed the triage vital signs and the nursing notes.  Pertinent labs & imaging results  that were available during my care of the patient were reviewed by me and considered in my medical decision making (see chart for details).     Patient is well-appearing, afebrile, nontoxic, nontachycardic.  Small lesion appears to be cystic with no associated fluid or vesicle.  No drainable fluid collection was noted on exam.  Attempted to unroofed this with a tongue depressor unsuccessfully.  Area was swabbed for HSV the low suspicion for this being etiology given she has already had symptoms for almost a week.  Recommended that she keep the area clean and follow-up with OB/GYN for further evaluation and management.  We will contact her if any of the testing is positive will need to arrange additional treatment.  She did request STI testing during pelvic exam which was obtained.  She was noted to have discharge and so we will test for STIs as well as BV and yeast.  She was encouraged to use hypoallergenic soaps and detergents.  We discussed that if anything changes and she has additional lesions, associated pain, abnormal drainage, vaginal discharge, fever, nausea, vomiting she needs to be seen immediately.  Strict return precautions given.  All questions were answered to patient satisfaction and she expressed agreement with treatment plan.  Final Clinical Impressions(s) / UC Diagnoses   Final diagnoses:  Vaginal lesion  Vaginal discharge  Screening examination for STI     Discharge Instructions      I will contact you if any of your testing is positive.  Please follow-up with OB/GYN for further evaluation and management.  Call them to schedule an appointment.  Wear loosefitting cotton underwear and use hypoallergenic soaps and detergents.  If you have any pain, additional lesions, fever, nausea/vomiting interfering with oral intake you need to be seen immediately.     ED Prescriptions   None    PDMP not reviewed this encounter.   Jeani Hawking, PA-C 04/15/23 1513

## 2023-04-15 NOTE — Discharge Instructions (Signed)
 I will contact you if any of your testing is positive.  Please follow-up with OB/GYN for further evaluation and management.  Call them to schedule an appointment.  Wear loosefitting cotton underwear and use hypoallergenic soaps and detergents.  If you have any pain, additional lesions, fever, nausea/vomiting interfering with oral intake you need to be seen immediately.

## 2023-04-18 LAB — CERVICOVAGINAL ANCILLARY ONLY
Bacterial Vaginitis (gardnerella): POSITIVE — AB
Candida Glabrata: NEGATIVE
Candida Vaginitis: NEGATIVE
Chlamydia: NEGATIVE
Comment: NEGATIVE
Comment: NEGATIVE
Comment: NEGATIVE
Comment: NEGATIVE
Comment: NEGATIVE
Comment: NORMAL
Neisseria Gonorrhea: NEGATIVE
Trichomonas: NEGATIVE

## 2023-04-19 LAB — HSV CULTURE AND TYPING

## 2023-06-28 ENCOUNTER — Encounter: Payer: Self-pay | Admitting: Family

## 2023-06-28 ENCOUNTER — Ambulatory Visit (INDEPENDENT_AMBULATORY_CARE_PROVIDER_SITE_OTHER): Payer: BC Managed Care – PPO | Admitting: Family

## 2023-06-28 ENCOUNTER — Other Ambulatory Visit (HOSPITAL_COMMUNITY)
Admission: RE | Admit: 2023-06-28 | Discharge: 2023-06-28 | Disposition: A | Discharge status: Home / Self Care | Source: Ambulatory Visit | Attending: Family | Admitting: Family

## 2023-06-28 ENCOUNTER — Other Ambulatory Visit: Payer: Self-pay | Admitting: Family

## 2023-06-28 VITALS — Ht 63.0 in | Wt 111.0 lb

## 2023-06-28 DIAGNOSIS — N760 Acute vaginitis: Secondary | ICD-10-CM | POA: Insufficient documentation

## 2023-06-28 DIAGNOSIS — R8290 Unspecified abnormal findings in urine: Secondary | ICD-10-CM | POA: Diagnosis present

## 2023-06-28 DIAGNOSIS — N3000 Acute cystitis without hematuria: Secondary | ICD-10-CM

## 2023-06-28 DIAGNOSIS — B9689 Other specified bacterial agents as the cause of diseases classified elsewhere: Secondary | ICD-10-CM | POA: Diagnosis not present

## 2023-06-28 DIAGNOSIS — Z7689 Persons encountering health services in other specified circumstances: Secondary | ICD-10-CM | POA: Diagnosis not present

## 2023-06-28 DIAGNOSIS — Z113 Encounter for screening for infections with a predominantly sexual mode of transmission: Secondary | ICD-10-CM | POA: Insufficient documentation

## 2023-06-28 LAB — POCT URINALYSIS DIP (CLINITEK)
Bilirubin, UA: NEGATIVE
Blood, UA: NEGATIVE
Glucose, UA: NEGATIVE mg/dL
Ketones, POC UA: NEGATIVE mg/dL
Leukocytes, UA: NEGATIVE
Nitrite, UA: POSITIVE — AB
POC PROTEIN,UA: NEGATIVE
Spec Grav, UA: 1.025 (ref 1.010–1.025)
Urobilinogen, UA: 0.2 U/dL
pH, UA: 7 (ref 5.0–8.0)

## 2023-06-28 MED ORDER — NITROFURANTOIN MONOHYD MACRO 100 MG PO CAPS
100.0000 mg | ORAL_CAPSULE | Freq: Two times a day (BID) | ORAL | 0 refills | Status: AC
Start: 2023-06-28 — End: 2023-07-05

## 2023-06-28 NOTE — Progress Notes (Signed)
 Patient is here to established care with provider. ~health hx address ~care gaps address

## 2023-06-28 NOTE — Progress Notes (Signed)
  Subjective:    Kristine Conway - 23 y.o. female MRN [id number]  Date of birth: Jul 03, 2000  HPI  [name] is to establish care.   Current issues and/or concerns: - States urine odor. Denies additional symptoms.  - No further issues/concerns for discussion today.  ROS per HPI    Health Maintenance:  Health Maintenance Due  Topic Date Due   Hepatitis C Screening  Never done     Past Medical History: Patient Active Problem List   Diagnosis Date Noted   Menorrhagia with regular cycle 03/17/2017   Iron deficiency anemia due to chronic blood loss 03/17/2017      Social History   reports that she has never smoked. She has never used smokeless tobacco. She reports current alcohol use of about 1.0 standard drink of alcohol per week. She reports that she does not currently use drugs.   Family History  family history includes Hypertension in her mother.   Medications: reviewed and updated   Objective:   Physical Exam Ht 5\' 3"  (1.6 m)   Wt 111 lb (50.3 kg)   BMI 19.66 kg/m   Physical Exam HENT:     Head: Normocephalic and atraumatic.     Nose: Nose normal.     Mouth/Throat:     Mouth: Mucous membranes are moist.     Pharynx: Oropharynx is clear.  Eyes:     Extraocular Movements: Extraocular movements intact.     Conjunctiva/sclera: Conjunctivae normal.     Pupils: Pupils are equal, round, and reactive to light.  Cardiovascular:     Rate and Rhythm: Normal rate and regular rhythm.     Pulses: Normal pulses.     Heart sounds: Normal heart sounds.  Pulmonary:     Effort: Pulmonary effort is normal.     Breath sounds: Normal breath sounds.  Musculoskeletal:        General: Normal range of motion.     Cervical back: Normal range of motion and neck supple.  Neurological:     General: No focal deficit present.     Mental Status: She is alert and oriented to person, place, and time.  Psychiatric:        Mood and Affect: Mood normal.        Behavior: Behavior  normal.        Assessment & Plan:  1. Encounter to establish care (Primary) - Patient presents today to establish care. During the interim follow-up with primary provider as scheduled.  - Return for annual physical examination, labs, and health maintenance. Arrive fasting meaning having no food for at least 8 hours prior to appointment. You may have only water or black coffee. Please take scheduled medications as normal.  2. Abnormal urine odor - Routine screening.  - POCT URINALYSIS DIP (CLINITEK); Future - Cervicovaginal ancillary only    Patient was given clear instructions to go to Emergency Department or return to medical center if symptoms don't improve, worsen, or new problems develop.The patient verbalized understanding.  I discussed the assessment and treatment plan with the patient. The patient was provided an opportunity to ask questions and all were answered. The patient agreed with the plan and demonstrated an understanding of the instructions.   The patient was advised to call back or seek an in-person evaluation if the symptoms worsen or if the condition fails to improve as anticipated.    Lavona Pounds, NP 06/28/2023, 1:49 PM Primary Care at [facility]

## 2023-06-29 ENCOUNTER — Other Ambulatory Visit: Payer: Self-pay | Admitting: Family

## 2023-06-29 DIAGNOSIS — B9689 Other specified bacterial agents as the cause of diseases classified elsewhere: Secondary | ICD-10-CM

## 2023-06-29 LAB — CERVICOVAGINAL ANCILLARY ONLY
Bacterial Vaginitis (gardnerella): POSITIVE — AB
Candida Glabrata: NEGATIVE
Candida Vaginitis: NEGATIVE
Chlamydia: NEGATIVE
Comment: NEGATIVE
Comment: NEGATIVE
Comment: NEGATIVE
Comment: NEGATIVE
Comment: NEGATIVE
Comment: NORMAL
Neisseria Gonorrhea: NEGATIVE
Trichomonas: NEGATIVE

## 2023-06-29 MED ORDER — METRONIDAZOLE 500 MG PO TABS
500.0000 mg | ORAL_TABLET | Freq: Two times a day (BID) | ORAL | 0 refills | Status: AC
Start: 2023-06-29 — End: 2023-07-06

## 2023-06-30 ENCOUNTER — Telehealth: Payer: Self-pay | Admitting: Emergency Medicine

## 2023-06-30 ENCOUNTER — Encounter: Payer: Self-pay | Admitting: Emergency Medicine

## 2023-06-30 NOTE — Telephone Encounter (Signed)
-----   Message from Senaida Dama sent at 06/29/2023  5:24 PM EDT ----- Bacterial vaginitis positive. Metronidazole  prescribed.

## 2023-06-30 NOTE — Progress Notes (Signed)
 You can come back up her and get one

## 2023-09-30 ENCOUNTER — Telehealth: Admitting: Family Medicine

## 2023-09-30 DIAGNOSIS — R8290 Unspecified abnormal findings in urine: Secondary | ICD-10-CM | POA: Diagnosis not present

## 2023-09-30 MED ORDER — NITROFURANTOIN MONOHYD MACRO 100 MG PO CAPS: 100.0000 mg | ORAL_CAPSULE | Freq: Two times a day (BID) | ORAL | 0 refills | Status: AC

## 2023-09-30 NOTE — Progress Notes (Signed)
 Virtual Visit Consent   Kristine Conway, you are scheduled for a virtual visit with a [facility] provider today. Just as with appointments in the office, your consent must be obtained to participate. Your consent will be active for this visit and any virtual visit you may have with one of our providers in the next 365 days. If you have a MyChart account, a copy of this consent can be sent to you electronically.  As this is a virtual visit, video technology does not allow for your provider to perform a traditional examination. This may limit your provider's ability to fully assess your condition. If your provider identifies any concerns that need to be evaluated in person or the need to arrange testing (such as labs, EKG, etc.), we will make arrangements to do so. Although advances in technology are sophisticated, we cannot ensure that it will always work on either your end or our end. If the connection with a video visit is poor, the visit may have to be switched to a telephone visit. With either a video or telephone visit, we are not always able to ensure that we have a secure connection.  By engaging in this virtual visit, you consent to the provision of healthcare and authorize for your insurance to be billed (if applicable) for the services provided during this visit. Depending on your insurance coverage, you may receive a charge related to this service.  I need to obtain your verbal consent now. Are you willing to proceed with your visit today? Kristine Conway has provided verbal consent on 09/30/2023 for a virtual visit (video or telephone). Kristine Lamp, FNP  Date: 09/30/2023 4:47 PM   Virtual Visit via Video Note   I, Kristine Conway, connected with  Kristine Conway  ([id number], 2000-11-02) on 09/30/23 at  4:45 PM EDT by a video-enabled telemedicine application and verified that I am speaking with the correct person using two identifiers.  Location: Patient: Virtual Visit Location Patient:  Home Provider: Virtual Visit Location Provider: Home Office   I discussed the limitations of evaluation and management by telemedicine and the availability of in person appointments. The patient expressed understanding and agreed to proceed.    History of Present Illness: Kristine Conway is a 23 y.o. who identifies as a female who was assigned female at birth, and is being seen today for strong urine odor like ammonia. No burning, frequency, vag odor, vag itching, or vag discharge. She says she was treated a few months ago and odor went away. Kristine Conway  HPI: HPI  Problems:  Patient Active Problem List   Diagnosis Date Noted   Menorrhagia with regular cycle 03/17/2017   Iron deficiency anemia due to chronic blood loss 03/17/2017    Allergies: No Known Allergies Medications:  Current Outpatient Medications:    docusate sodium  (COLACE) 100 MG capsule, Take 1 capsule (100 mg total) by mouth every 12 (twelve) hours., Disp: 60 capsule, Rfl: 0   ferrous sulfate  325 (65 FE) MG tablet, Take 325 mg by mouth., Disp: , Rfl:   Observations/Objective: Patient is well-developed, well-nourished in no acute distress.  Resting comfortably  at home.  Head is normocephalic, atraumatic.  No labored breathing.  Speech is clear and coherent with logical content.  Patient is alert and oriented at baseline.    Assessment and Plan: 1. Abnormal urine odor (Primary)  F.U with pcp.   Follow Up Instructions: I discussed the assessment and treatment plan with the patient. The patient was provided an opportunity to  ask questions and all were answered. The patient agreed with the plan and demonstrated an understanding of the instructions.  A copy of instructions were sent to the patient via MyChart unless otherwise noted below.     The patient was advised to call back or seek an in-person evaluation if the symptoms worsen or if the condition fails to improve as anticipated.    Kristine Gauthier, FNP

## 2023-09-30 NOTE — Progress Notes (Signed)
 Pt did not show up for visit-DWB

## 2023-09-30 NOTE — Patient Instructions (Signed)
